# Patient Record
Sex: Male | Born: 1965 | Race: White | Hispanic: No | Marital: Single | Smoking: Former smoker
Health system: Southern US, Community
[De-identification: ages and names within clinical notes are randomized; demographics above are authoritative.]

## PROBLEM LIST (undated history)

## (undated) DIAGNOSIS — Z21 Asymptomatic human immunodeficiency virus [HIV] infection status: Secondary | ICD-10-CM

## (undated) DIAGNOSIS — B20 Human immunodeficiency virus [HIV] disease: Secondary | ICD-10-CM

---

## 2000-07-09 ENCOUNTER — Emergency Department (HOSPITAL_COMMUNITY): Admission: EM | Admit: 2000-07-09 | Discharge: 2000-07-09 | Payer: Self-pay | Admitting: Emergency Medicine

## 2002-01-25 ENCOUNTER — Ambulatory Visit (HOSPITAL_COMMUNITY): Admission: RE | Admit: 2002-01-25 | Discharge: 2002-01-25 | Payer: Self-pay | Admitting: Family Medicine

## 2002-01-25 ENCOUNTER — Encounter: Payer: Self-pay | Admitting: Family Medicine

## 2007-08-11 ENCOUNTER — Encounter: Payer: Self-pay | Admitting: Infectious Diseases

## 2007-08-18 ENCOUNTER — Ambulatory Visit: Payer: Self-pay | Admitting: Infectious Diseases

## 2007-08-18 ENCOUNTER — Telehealth: Payer: Self-pay

## 2007-08-18 ENCOUNTER — Encounter: Admission: RE | Admit: 2007-08-18 | Discharge: 2007-08-18 | Payer: Self-pay | Admitting: Infectious Diseases

## 2007-08-18 DIAGNOSIS — B20 Human immunodeficiency virus [HIV] disease: Secondary | ICD-10-CM | POA: Insufficient documentation

## 2007-08-18 LAB — CONVERTED CEMR LAB
HIV 1 RNA Quant: 210000 copies/mL — ABNORMAL HIGH (ref <50)
HIV-1 RNA Quant, Log: 5.32 — ABNORMAL HIGH (ref <1.70)

## 2007-08-22 LAB — CONVERTED CEMR LAB
ALT: 123 units/L — ABNORMAL HIGH (ref 0–53)
AST: 54 units/L — ABNORMAL HIGH (ref 0–37)
Albumin: 3.7 g/dL (ref 3.5–5.2)
Alkaline Phosphatase: 73 units/L (ref 39–117)
BUN: 10 mg/dL (ref 6–23)
Basophils Absolute: 0.1 10*3/uL (ref 0.0–0.1)
Basophils Relative: 2 % — ABNORMAL HIGH (ref 0–1)
CO2: 27 meq/L (ref 19–32)
Calcium: 8.5 mg/dL (ref 8.4–10.5)
Chlamydia, Swab/Urine, PCR: NEGATIVE
Chloride: 109 meq/L (ref 96–112)
Cholesterol: 135 mg/dL (ref 0–200)
Creatinine, Ser: 1.02 mg/dL (ref 0.40–1.50)
Eosinophils Absolute: 0 10*3/uL (ref 0.0–0.7)
Eosinophils Relative: 0 % (ref 0–5)
GC Probe Amp, Urine: NEGATIVE
Glucose, Bld: 91 mg/dL (ref 70–99)
HCT: 44.7 % (ref 39.0–52.0)
HCV Ab: NEGATIVE
HDL: 23 mg/dL — ABNORMAL LOW (ref >39)
Hemoglobin: 14.7 g/dL (ref 13.0–17.0)
Hep A Total Ab: NEGATIVE
Hep B Core Total Ab: POSITIVE — AB
Hep B S Ab: POSITIVE — AB
Hepatitis B Surface Ag: NEGATIVE
LDL Cholesterol: 88 mg/dL (ref 0–99)
Lymphocytes Relative: 58 % — ABNORMAL HIGH (ref 12–46)
Lymphs Abs: 2.9 10*3/uL (ref 0.7–4.0)
MCHC: 32.9 g/dL (ref 30.0–36.0)
MCV: 93.9 fL (ref 78.0–100.0)
Monocytes Absolute: 0.7 10*3/uL (ref 0.1–1.0)
Monocytes Relative: 14 % — ABNORMAL HIGH (ref 3–12)
Neutro Abs: 1.2 10*3/uL — ABNORMAL LOW (ref 1.7–7.7)
Neutrophils Relative %: 25 % — ABNORMAL LOW (ref 43–77)
Platelets: 96 10*3/uL — ABNORMAL LOW (ref 150–400)
Potassium: 4 meq/L (ref 3.5–5.3)
RBC: 4.76 M/uL (ref 4.22–5.81)
RDW: 12.5 % (ref 11.5–15.5)
Sodium: 145 meq/L (ref 135–145)
Total Bilirubin: 0.6 mg/dL (ref 0.3–1.2)
Total CHOL/HDL Ratio: 5.9
Total Protein: 6.5 g/dL (ref 6.0–8.3)
Triglycerides: 121 mg/dL (ref <150)
VLDL: 24 mg/dL (ref 0–40)
WBC: 4.9 10*3/uL (ref 4.0–10.5)

## 2007-09-13 ENCOUNTER — Encounter: Admission: RE | Admit: 2007-09-13 | Discharge: 2007-09-13 | Payer: Self-pay | Admitting: Infectious Diseases

## 2007-09-13 ENCOUNTER — Ambulatory Visit: Payer: Self-pay | Admitting: Infectious Diseases

## 2007-09-13 LAB — CONVERTED CEMR LAB
BUN: 16 mg/dL (ref 6–23)
Basophils Absolute: 0 10*3/uL (ref 0.0–0.1)
Basophils Relative: 1 % (ref 0–1)
CO2: 25 meq/L (ref 19–32)
Calcium: 8.9 mg/dL (ref 8.4–10.5)
Chloride: 108 meq/L (ref 96–112)
Creatinine, Ser: 0.95 mg/dL (ref 0.40–1.50)
Eosinophils Absolute: 0.1 10*3/uL (ref 0.0–0.7)
Eosinophils Relative: 2 % (ref 0–5)
Glucose, Bld: 93 mg/dL (ref 70–99)
HCT: 47.5 % (ref 39.0–52.0)
HIV 1 RNA Quant: 935 copies/mL — ABNORMAL HIGH (ref <50)
HIV-1 RNA Quant, Log: 2.97 — ABNORMAL HIGH (ref <1.70)
Hemoglobin: 15.8 g/dL (ref 13.0–17.0)
Lymphocytes Relative: 31 % (ref 12–46)
Lymphs Abs: 1.1 10*3/uL (ref 0.7–4.0)
MCHC: 33.3 g/dL (ref 30.0–36.0)
MCV: 93.9 fL (ref 78.0–100.0)
Monocytes Absolute: 0.4 10*3/uL (ref 0.1–1.0)
Monocytes Relative: 12 % (ref 3–12)
Neutro Abs: 2.1 10*3/uL (ref 1.7–7.7)
Neutrophils Relative %: 56 % (ref 43–77)
Platelets: 122 10*3/uL — ABNORMAL LOW (ref 150–400)
Potassium: 4.6 meq/L (ref 3.5–5.3)
RBC: 5.06 M/uL (ref 4.22–5.81)
RDW: 13.5 % (ref 11.5–15.5)
Sodium: 142 meq/L (ref 135–145)
WBC: 3.7 10*3/uL — ABNORMAL LOW (ref 4.0–10.5)

## 2007-12-14 ENCOUNTER — Encounter: Payer: Self-pay | Admitting: Infectious Diseases

## 2007-12-18 ENCOUNTER — Encounter: Payer: Self-pay | Admitting: Infectious Diseases

## 2007-12-26 ENCOUNTER — Encounter: Payer: Self-pay | Admitting: Infectious Diseases

## 2008-01-17 ENCOUNTER — Ambulatory Visit: Payer: Self-pay | Admitting: Infectious Diseases

## 2008-05-14 ENCOUNTER — Encounter: Payer: Self-pay | Admitting: Infectious Diseases

## 2008-06-17 ENCOUNTER — Encounter: Payer: Self-pay | Admitting: Infectious Diseases

## 2008-06-17 ENCOUNTER — Telehealth: Payer: Self-pay

## 2008-07-03 ENCOUNTER — Ambulatory Visit: Payer: Self-pay | Admitting: Infectious Diseases

## 2008-09-17 ENCOUNTER — Telehealth: Payer: Self-pay

## 2008-09-18 ENCOUNTER — Ambulatory Visit: Payer: Self-pay | Admitting: Infectious Diseases

## 2008-09-18 DIAGNOSIS — F329 Major depressive disorder, single episode, unspecified: Secondary | ICD-10-CM

## 2008-09-18 DIAGNOSIS — F32A Depression, unspecified: Secondary | ICD-10-CM | POA: Insufficient documentation

## 2008-09-20 ENCOUNTER — Telehealth (INDEPENDENT_AMBULATORY_CARE_PROVIDER_SITE_OTHER): Payer: Self-pay | Admitting: *Deleted

## 2008-10-08 ENCOUNTER — Ambulatory Visit: Payer: Self-pay | Admitting: Infectious Diseases

## 2008-10-08 DIAGNOSIS — H1045 Other chronic allergic conjunctivitis: Secondary | ICD-10-CM | POA: Insufficient documentation

## 2008-10-08 DIAGNOSIS — A539 Syphilis, unspecified: Secondary | ICD-10-CM | POA: Insufficient documentation

## 2008-10-08 DIAGNOSIS — A530 Latent syphilis, unspecified as early or late: Secondary | ICD-10-CM | POA: Insufficient documentation

## 2008-12-24 ENCOUNTER — Encounter: Payer: Self-pay | Admitting: Infectious Diseases

## 2009-01-08 ENCOUNTER — Ambulatory Visit: Payer: Self-pay | Admitting: Infectious Diseases

## 2009-06-11 ENCOUNTER — Encounter: Payer: Self-pay | Admitting: Infectious Diseases

## 2009-07-07 ENCOUNTER — Ambulatory Visit: Payer: Self-pay | Admitting: Infectious Diseases

## 2009-12-24 ENCOUNTER — Encounter: Payer: Self-pay | Admitting: Infectious Diseases

## 2010-05-15 ENCOUNTER — Encounter: Payer: Self-pay | Admitting: Infectious Diseases

## 2010-05-20 ENCOUNTER — Encounter: Payer: Self-pay | Admitting: Infectious Diseases

## 2010-05-20 ENCOUNTER — Ambulatory Visit
Admission: RE | Admit: 2010-05-20 | Discharge: 2010-05-20 | Payer: Self-pay | Source: Home / Self Care | Attending: Infectious Diseases | Admitting: Infectious Diseases

## 2010-05-20 LAB — CONVERTED CEMR LAB: HIV 1 RNA Quant: <20 copies/mL (ref <20)

## 2010-05-28 ENCOUNTER — Encounter (INDEPENDENT_AMBULATORY_CARE_PROVIDER_SITE_OTHER): Payer: Self-pay | Admitting: *Deleted

## 2010-05-28 NOTE — Assessment & Plan Note (Signed)
Summary: 62month f/u [mkj]   CC:  follow-up visit.  History of Present Illness: 45 yo M with previous dx of acute HIV and cytopenias. He was started on atripla 4-09. His CD4 has gone from CD4 346, VL 1,000; Chol 185,  RPR - (05-15-10). worried about mom who has PNA and irregular heart beat.    Preventive Screening-Counseling & Management  Alcohol-Tobacco     Alcohol drinks/day: 0     Alcohol type: quit      Smoking Status: never     Year Quit: 1990     Passive Smoke Exposure: no  Caffeine-Diet-Exercise     Caffeine use/day: coffee and tea     Does Patient Exercise: yes     Type of exercise: gym membership     Exercise (avg: min/session): >60     Times/week: 4  Hep-HIV-STD-Contraception     HIV Risk: risk noted  Safety-Violence-Falls     Seat Belt Use: yes      Sexual History:  multiple partners currently.        Drug Use:  never.        Blood Transfusions:  no.    Comments: pt. declined condoms   Updated Prior Medication List: ATRIPLA 600-200-300 MG  TABS (EFAVIRENZ-EMTRICITAB-TENOFOVIR) Take 1 tablet by mouth at bedtime MULTIVITAMINS  CAPS (MULTIPLE VITAMIN) Take 1 tablet by mouth once a day  Current Allergies (reviewed today): No known allergies  Past History:  Past medical, surgical, family and social histories (including risk factors) reviewed, and no changes noted (except as noted below).  Past Medical History: Reviewed history from 09/18/2008 and no changes required. allergic sinusitis HIV/AIDS Depression (May 2010)  Family History: Reviewed history from 09/18/2008 and no changes required. Family History Diabetes 1st degree relative Grandfathers died of MI (52's, 22's). grnadmother died of MI, 2 uncles with MI brother HIV+  Social History: Reviewed history from 08/18/2007 and no changes required. Single Never Smoked Alcohol use-yes, monthly  Drug use-no  Review of Systems       exercsing  Vital Signs:  Patient profile:   45 year old  male Height:      70 inches (177.80 cm) Weight:      196.4 pounds (89.27 kg) BMI:     28.28 Temp:     98.1 degrees F (36.72 degrees C) oral Pulse rate:   60 / minute BP sitting:   115 / 75  (left arm)  Vitals Entered By: Wendall Mola CMA Duncan Dull) (May 20, 2010 3:59 PM) CC: follow-up visit Is Patient Diabetic? No Pain Assessment Patient in pain? no      Nutritional Status BMI of 25 - 29 = overweight Nutritional Status Detail appetite "good"  Have you ever been in a relationship where you felt threatened, hurt or afraid?No   Does patient need assistance? Functional Status Self care Ambulation Normal Comments no missed doses of Atripla per pt.   Physical Exam  General:  well-developed, well-nourished, and well-hydrated.   Eyes:  pupils equal, pupils round, and pupils reactive to light.   Mouth:  pharynx pink and moist and no exudates.   Neck:  no masses.   Lungs:  normal respiratory effort and normal breath sounds.   Heart:  normal rate, regular rhythm, and no murmur.   Abdomen:  soft, non-tender, and normal bowel sounds.     Impression & Recommendations:  Problem # 1:  AIDS (ICD-042) he is doing well but has a detectable virus. WIll check a VL and  geno on him. vax uptodate. will see him back in 4-5 months, sooner if geno abn.   Orders: T-HIV Genotype ([id number]) T-HIV Viral Load [phone])  Other Orders: Est. Patient Level III (13086)         Medication Adherence: 05/20/2010   Adherence to medications reviewed with patient. Counseling to provide adequate adherence provided   Prevention For Positives: 05/20/2010   Safe sex practices discussed with patient. Condoms offered.                              Immunization History:  Influenza Immunization History:    Influenza:  historical (01/22/2010)   Appended Document: Orders Update    Clinical Lists Changes  Orders: Added new Test order of T-HIV1 Quant rflx Ultra or Genotype  ([id number]) - Signed

## 2010-05-28 NOTE — Assessment & Plan Note (Signed)
Summary: Barry Zhang   CC:  follow-up visit.  History of Present Illness: 45 yo M with previous dx of acute HIV and cytopenias. He was started on atripla 4-09. His CD4 has gone from 140 to CD4 298, VL 490, Chol 153, RPR -. (06-11-09). He is doing well- eating well, sleeping well, exercising, moving bowels well, passing urine without difficulty,   Preventive Screening-Counseling & Management  Alcohol-Tobacco     Alcohol drinks/day: 0     Alcohol type: quit      Smoking Status: never     Year Quit: 1990     Passive Smoke Exposure: no  Caffeine-Diet-Exercise     Caffeine use/day: coffee and tea     Does Patient Exercise: yes     Type of exercise: gym membership     Exercise (avg: min/session): >60     Times/week: 4  Safety-Violence-Falls     Seat Belt Use: yes   Updated Prior Medication List: TYLENOL 325 MG  TABS (ACETAMINOPHEN) as needed ATRIPLA 600-200-300 MG  TABS (EFAVIRENZ-EMTRICITAB-TENOFOVIR) Take 1 tablet by mouth at bedtime MULTIVITAMINS  CAPS (MULTIPLE VITAMIN) Take 1 tablet by mouth once a day  Current Allergies (reviewed today): No known allergies  Current Medications (verified): 1)  Tylenol 325 Mg  Tabs (Acetaminophen) .... As Needed 2)  Atripla 600-200-300 Mg  Tabs (Efavirenz-Emtricitab-Tenofovir) .... Take 1 Tablet By Mouth At Bedtime 3)  Multivitamins  Caps (Multiple Vitamin) .... Take 1 Tablet By Mouth Once A Day  Allergies (verified): No Known Drug Allergies   Vital Signs:  Patient profile:   45 year old male Height:      70 inches (177.80 cm) Weight:      197.8 pounds (89.91 kg) BMI:     28.48 Temp:     97.2 degrees F (36.22 degrees C) oral Pulse rate:   76 / minute BP sitting:   109 / 76  (right arm)  Vitals Entered By: Baxter Hire) (July 07, 2009 9:00 AM) CC: follow-up visit Pain Assessment Patient in pain? no      Nutritional Status BMI of 25 - 29 = overweight Nutritional Status Detail appetite is good per patient  Have you ever  been in a relationship where you felt threatened, hurt or afraid?No   Does patient need assistance? Functional Status Self care Ambulation Normal        Medication Adherence: 07/07/2009   Adherence to medications reviewed with patient. Counseling to provide adequate adherence provided   Prevention For Positives: 07/07/2009   Safe sex practices discussed with patient. Condoms offered.                             Physical Exam  General:  well-developed, well-nourished, and well-hydrated.   Eyes:  pupils equal, pupils round, and pupils reactive to light.   Mouth:  pharynx pink and moist and no exudates.   Neck:  no masses.   Lungs:  normal respiratory effort and normal breath sounds.   Heart:  normal rate, regular rhythm, and no murmur.   Abdomen:  soft, non-tender, and normal bowel sounds.     Impression & Recommendations:  Problem # 1:  AIDS (ICD-042) doing well. he will return to clinic 1 year with labs done in 6 months at his work and faxed here. he is offered condoms.   Problem # 2:  DEPRESSION (ICD-311) spoke with him at length. he is not depressed. has not sought out support  groups, just simply feels like he is "living with it" and accepted dx.   Other Orders: Est. Patient Level IV (54098)

## 2010-05-29 ENCOUNTER — Telehealth (INDEPENDENT_AMBULATORY_CARE_PROVIDER_SITE_OTHER): Payer: Self-pay | Admitting: *Deleted

## 2010-06-03 ENCOUNTER — Encounter (INDEPENDENT_AMBULATORY_CARE_PROVIDER_SITE_OTHER): Payer: Self-pay | Admitting: *Deleted

## 2010-06-03 NOTE — Progress Notes (Signed)
Summary: lab results  Phone Note Call from Patient   Caller: Patient Reason for Call: Lab or Test Results Summary of Call: pt. called for lab results, results given Initial call taken by: Wendall Mola CMA Duncan Dull),  May 29, 2010 11:07 AM

## 2010-06-03 NOTE — Miscellaneous (Signed)
  Clinical Lists Changes  Observations: Added new observation of INCOMESOURCE: UNKNOWN (05/28/2010 14:05) Added new observation of HOUSEINCOME: 0  (05/28/2010 14:05) Added new observation of #CHILD<18 IN: Unknown  (05/28/2010 14:05) Added new observation of FAMILYSIZE: 1  (05/28/2010 14:05) Added new observation of HOUSING: Unknown  (05/28/2010 14:05) Added new observation of YEARLYEXPEN: 0  (05/28/2010 14:05)

## 2010-06-11 NOTE — Miscellaneous (Signed)
  Clinical Lists Changes 

## 2011-01-19 LAB — T-HELPER CELL (CD4) - (RCID CLINIC ONLY)
CD4 % Helper T Cell: 5 — ABNORMAL LOW
CD4 T Cell Abs: 140 — ABNORMAL LOW

## 2011-01-20 LAB — T-HELPER CELL (CD4) - (RCID CLINIC ONLY): CD4 T Cell Abs: 250 — ABNORMAL LOW

## 2011-06-23 ENCOUNTER — Other Ambulatory Visit: Payer: Self-pay | Admitting: *Deleted

## 2011-06-23 DIAGNOSIS — B20 Human immunodeficiency virus [HIV] disease: Secondary | ICD-10-CM

## 2011-06-23 MED ORDER — EFAVIRENZ-EMTRICITAB-TENOFOVIR 600-200-300 MG PO TABS: 1.0000 | ORAL_TABLET | Freq: Every day | ORAL | Status: DC

## 2011-07-13 ENCOUNTER — Ambulatory Visit: Payer: Self-pay | Admitting: Infectious Diseases

## 2011-07-14 ENCOUNTER — Ambulatory Visit (INDEPENDENT_AMBULATORY_CARE_PROVIDER_SITE_OTHER): Payer: BC Managed Care – PPO | Admitting: Infectious Disease

## 2011-07-14 ENCOUNTER — Telehealth: Payer: Self-pay | Admitting: Licensed Clinical Social Worker

## 2011-07-14 ENCOUNTER — Other Ambulatory Visit: Payer: Self-pay | Admitting: Infectious Disease

## 2011-07-14 ENCOUNTER — Encounter: Payer: Self-pay | Admitting: Infectious Disease

## 2011-07-14 VITALS — BP 143/95 | HR 78 | Temp 97.4°F | Ht 70.0 in | Wt 187.3 lb

## 2011-07-14 DIAGNOSIS — J029 Acute pharyngitis, unspecified: Secondary | ICD-10-CM

## 2011-07-14 DIAGNOSIS — B20 Human immunodeficiency virus [HIV] disease: Secondary | ICD-10-CM

## 2011-07-14 DIAGNOSIS — R300 Dysuria: Secondary | ICD-10-CM

## 2011-07-14 DIAGNOSIS — Z113 Encounter for screening for infections with a predominantly sexual mode of transmission: Secondary | ICD-10-CM

## 2011-07-14 LAB — COMPLETE METABOLIC PANEL WITH GFR
ALT: 19 U/L (ref 0–53)
Albumin: 4.1 g/dL (ref 3.5–5.2)
Alkaline Phosphatase: 79 U/L (ref 39–117)
GFR, Est Non African American: >89 mL/min
Glucose, Bld: 115 mg/dL — ABNORMAL HIGH (ref 70–99)
Potassium: 4.3 mEq/L (ref 3.5–5.3)
Sodium: 144 mEq/L (ref 135–145)
Total Bilirubin: 0.4 mg/dL (ref 0.3–1.2)
Total Protein: 6.5 g/dL (ref 6.0–8.3)

## 2011-07-14 LAB — CBC WITH DIFFERENTIAL/PLATELET
Eosinophils Relative: 2 % (ref 0–5)
HCT: 44.2 % (ref 39.0–52.0)
Lymphocytes Relative: 15 % (ref 12–46)
Lymphs Abs: 0.7 10*3/uL (ref 0.7–4.0)
MCV: 95.7 fL (ref 78.0–100.0)
Monocytes Absolute: 0.5 10*3/uL (ref 0.1–1.0)
Monocytes Relative: 10 % (ref 3–12)
RBC: 4.62 MIL/uL (ref 4.22–5.81)
WBC: 4.8 10*3/uL (ref 4.0–10.5)

## 2011-07-14 LAB — URINALYSIS, ROUTINE W REFLEX MICROSCOPIC
Bilirubin Urine: NEGATIVE
Glucose, UA: NEGATIVE mg/dL
Protein, ur: NEGATIVE mg/dL
Urobilinogen, UA: 0.2 mg/dL (ref 0.0–1.0)

## 2011-07-14 LAB — URINALYSIS, MICROSCOPIC ONLY
Bacteria, UA: NONE SEEN
Casts: NONE SEEN
WBC, UA: >50 WBC/hpf — AB (ref <3)

## 2011-07-14 MED ORDER — LEVOFLOXACIN 500 MG PO TABS: 500.0000 mg | ORAL_TABLET | Freq: Every day | ORAL | Status: AC

## 2011-07-14 NOTE — Assessment & Plan Note (Signed)
Could have UTI. Check urine culture post prostate exam. Will give levaquin for 10 days empirically pending cultures

## 2011-07-14 NOTE — Telephone Encounter (Signed)
Patient called c/o painful urination, frequency, sore throat, past fevers and numbness in his groin area. He spoke with Dr. Luciana Axe this morning and was advised to make an appointment per the patient. I scheduled him to see Dr. Daiva Eves at 11:00am today.

## 2011-07-14 NOTE — Assessment & Plan Note (Signed)
No exudate. Not clear what link is if any to urinary and GU symptoms here

## 2011-07-14 NOTE — Assessment & Plan Note (Signed)
Emphasized need for more frequent visits given his viremia a year ago and his imperfect virological suppression. VL should be <20 consistently

## 2011-07-14 NOTE — Progress Notes (Signed)
Subjective:    Patient ID: Barry Zhang, male    DOB: [birth year], 46 y.o.   MRN: [id number]  HPI  46  Year old man with HIV previously well controlled on atripla but with recent viremia to 1k range last January 2012, most recent VL is 200 in February. His CD4 is 269 (all labs done at [facility]) who presents to clinic after onset of sorethroat and fevers on Sunday followed by numbness in his glans penis and dysuria and strong smelling urine. He has some suprapubic pain as well. He does not have nocturia. He denies being sexually active past several months. I spent greater than 45 minutes with the patient including greater than 50% of time in face to face counsel of the patient and in coordination of their care.   Review of Systems  Constitutional: Positive for activity change. Negative for fever, chills, diaphoresis, appetite change, fatigue and unexpected weight change.  HENT: Positive for sore throat. Negative for congestion, rhinorrhea, sneezing, trouble swallowing and sinus pressure.   Eyes: Negative for photophobia and visual disturbance.  Respiratory: Negative for cough, chest tightness, shortness of breath, wheezing and stridor.   Cardiovascular: Negative for chest pain, palpitations and leg swelling.  Gastrointestinal: Negative for nausea, vomiting, abdominal pain, diarrhea, constipation, blood in stool, abdominal distention and anal bleeding.  Genitourinary: Positive for dysuria. Negative for hematuria, flank pain and difficulty urinating.  Musculoskeletal: Negative for myalgias, back pain, joint swelling, arthralgias and gait problem.  Skin: Negative for color change, pallor, rash and wound.  Neurological: Negative for dizziness, tremors, weakness and light-headedness.  Hematological: Negative for adenopathy. Does not bruise/bleed easily.  Psychiatric/Behavioral: Negative for behavioral problems, confusion, sleep disturbance, dysphoric mood, decreased concentration and agitation.        Objective:   Physical Exam  Constitutional: He is oriented to person, place, and time. He appears well-developed and well-nourished. No distress.  HENT:  Head: Normocephalic and atraumatic.  Mouth/Throat: Oropharynx is clear and moist. No oropharyngeal exudate, posterior oropharyngeal edema, posterior oropharyngeal erythema or tonsillar abscesses.  Eyes: Conjunctivae and EOM are normal. Pupils are equal, round, and reactive to light. No scleral icterus.  Neck: Normal range of motion. Neck supple. No JVD present.  Cardiovascular: Normal rate, regular rhythm and normal heart sounds.  Exam reveals no gallop and no friction rub.   No murmur heard. Pulmonary/Chest: Effort normal and breath sounds normal. No respiratory distress. He has no wheezes. He has no rales. He exhibits no tenderness.  Abdominal: He exhibits no distension and no mass. There is no tenderness. There is no rebound and no guarding. Hernia confirmed negative in the right inguinal area and confirmed negative in the left inguinal area.  Genitourinary: Rectum normal and testes normal. Prostate is not enlarged and not tender. Right testis shows no mass. Left testis shows no mass. Circumcised. No penile erythema or penile tenderness. No discharge found.  Musculoskeletal: He exhibits no edema and no tenderness.  Lymphadenopathy:    He has no cervical adenopathy.       Right: No inguinal adenopathy present.       Left: No inguinal adenopathy present.  Neurological: He is alert and oriented to person, place, and time. He has normal reflexes. He exhibits normal muscle tone. Coordination normal.  Skin: Skin is warm and dry. He is not diaphoretic. No erythema. No pallor.  Psychiatric: He has a normal mood and affect. His behavior is normal. Judgment and thought content normal.  Assessment & Plan:  Dysuria Could have UTI. Check urine culture post prostate exam. Will give levaquin for 10 days empirically pending  cultures  Sore throat No exudate. Not clear what link is if any to urinary and GU symptoms here  AIDS Emphasized need for more frequent visits given his viremia a year ago and his imperfect virological suppression. VL should be <20 consistently

## 2011-07-16 ENCOUNTER — Telehealth: Payer: Self-pay | Admitting: *Deleted

## 2011-07-16 NOTE — Telephone Encounter (Signed)
He was here 2 days ago & wanted to know the lab results. I gave him what was in so far. He is going to call back on Monday to see if the rest are in

## 2011-08-04 ENCOUNTER — Telehealth: Payer: Self-pay | Admitting: *Deleted

## 2011-08-04 ENCOUNTER — Ambulatory Visit: Payer: Self-pay | Admitting: Infectious Diseases

## 2011-08-04 NOTE — Telephone Encounter (Signed)
I left a message asking him to call & reschedule the appt

## 2011-10-18 ENCOUNTER — Other Ambulatory Visit: Payer: Self-pay | Admitting: *Deleted

## 2011-10-18 DIAGNOSIS — B20 Human immunodeficiency virus [HIV] disease: Secondary | ICD-10-CM

## 2011-10-18 MED ORDER — EFAVIRENZ-EMTRICITAB-TENOFOVIR 600-200-300 MG PO TABS: 1.0000 | ORAL_TABLET | Freq: Every day | ORAL | Status: DC

## 2012-01-11 ENCOUNTER — Other Ambulatory Visit: Payer: Self-pay | Admitting: Infectious Diseases

## 2012-01-31 ENCOUNTER — Ambulatory Visit (INDEPENDENT_AMBULATORY_CARE_PROVIDER_SITE_OTHER): Payer: BC Managed Care – PPO | Admitting: Infectious Diseases

## 2012-01-31 ENCOUNTER — Encounter: Payer: Self-pay | Admitting: Infectious Diseases

## 2012-01-31 VITALS — BP 137/83 | HR 64 | Temp 97.6°F | Ht 70.0 in | Wt 187.0 lb

## 2012-01-31 DIAGNOSIS — B20 Human immunodeficiency virus [HIV] disease: Secondary | ICD-10-CM

## 2012-01-31 DIAGNOSIS — F329 Major depressive disorder, single episode, unspecified: Secondary | ICD-10-CM

## 2012-01-31 DIAGNOSIS — F3289 Other specified depressive episodes: Secondary | ICD-10-CM

## 2012-01-31 DIAGNOSIS — R21 Rash and other nonspecific skin eruption: Secondary | ICD-10-CM

## 2012-01-31 NOTE — Assessment & Plan Note (Signed)
Still has occas hives. Seems to be stress related.

## 2012-01-31 NOTE — Progress Notes (Signed)
  Subjective:    Patient ID: Barry Zhang, male    DOB: 18-Aug-1965, 46 y.o.   MRN: [id number]  HPI 46 yo M with previous dx of acute HIV and cytopenias. He was started on atripla 4-09. AT previous f/u had detectable VL. Missed last f/u appt (seen March for acute visit/UTI). Has labs done at work- last (01-10-12) VL 280 and CD4 328.     Review of Systems  Constitutional: Negative for fever, chills, appetite change and unexpected weight change.  Gastrointestinal: Negative for diarrhea and constipation.  Genitourinary: Negative for dysuria.       Objective:   Physical Exam  Constitutional: He appears well-developed and well-nourished.  HENT:  Mouth/Throat: No oropharyngeal exudate.  Eyes: EOM are normal. Pupils are equal, round, and reactive to light.  Neck: Neck supple.  Cardiovascular: Normal rate, regular rhythm and normal heart sounds.   Pulmonary/Chest: Effort normal.  Abdominal: Soft. Bowel sounds are normal. There is no tenderness.  Lymphadenopathy:    He has no cervical adenopathy.          Assessment & Plan:

## 2012-01-31 NOTE — Assessment & Plan Note (Signed)
Spoke with him at length about his detectable VL. He swears adherence. He is not sexually active. He gets his labs done at work. Got flu shot at work as well. Will see hm back in 6 months.

## 2012-01-31 NOTE — Assessment & Plan Note (Signed)
He becomes tearful when talking about his mother's recent cancer illness. She will have her clearance visit this month.Marland KitchenMarland Kitchen

## 2012-08-11 ENCOUNTER — Other Ambulatory Visit: Payer: Self-pay | Admitting: Infectious Diseases

## 2012-08-14 ENCOUNTER — Ambulatory Visit: Payer: BC Managed Care – PPO | Admitting: Infectious Diseases

## 2012-08-22 ENCOUNTER — Ambulatory Visit (INDEPENDENT_AMBULATORY_CARE_PROVIDER_SITE_OTHER): Payer: PRIVATE HEALTH INSURANCE | Admitting: Infectious Diseases

## 2012-08-22 ENCOUNTER — Encounter: Payer: Self-pay | Admitting: Infectious Diseases

## 2012-08-22 VITALS — BP 135/83 | HR 55 | Temp 97.7°F | Ht 71.0 in | Wt 189.5 lb

## 2012-08-22 DIAGNOSIS — Z23 Encounter for immunization: Secondary | ICD-10-CM

## 2012-08-22 DIAGNOSIS — B20 Human immunodeficiency virus [HIV] disease: Secondary | ICD-10-CM

## 2012-08-22 NOTE — Assessment & Plan Note (Signed)
Mr Ripple is doing very well. He denies missed ART. He is offered/refuses condoms. He is encouraged to watch his diet, exercise, wear his seat belt. He has a slightly elevated cardiac risk (HIV+) but no relatives with early cardiac death (9s and 18s). He is due for colonoscopy at 50.  Will see him back in 1 year, he will repeat his labs in 6 months and send them to [place].

## 2012-08-22 NOTE — Progress Notes (Signed)
  Subjective:    Patient ID: VALERIANO BAIN, male    DOB: 1966/02/26, 47 y.o.   MRN: [id number]  HPI 47 yo M with previous dx of acute HIV and cytopenias. He was started on atripla 4-09. He is without complaint (aside from waiting in room...) His labs are done at his work- CD4 265, VL 40, Chol 148, Trig 73.   HIV 1 RNA Quant (copies/mL)  Date Value  05/20/2010 <20 copies/mL   09/13/2007 935*  08/18/2007 210000*     CD4 T Cell Abs (no units)  Date Value  09/13/2007 250*  08/18/2007 140*     Review of Systems     Objective:   Physical Exam  Constitutional: He appears well-developed and well-nourished.  HENT:  Mouth/Throat: No oropharyngeal exudate.  Eyes: EOM are normal. Pupils are equal, round, and reactive to light.  Neck: Neck supple.  Cardiovascular: Normal rate, regular rhythm and normal heart sounds.   Pulmonary/Chest: Effort normal and breath sounds normal.  Abdominal: Soft. Bowel sounds are normal. There is no tenderness.  Lymphadenopathy:    He has no cervical adenopathy.          Assessment & Plan:

## 2013-02-28 ENCOUNTER — Telehealth: Payer: Self-pay | Admitting: *Deleted

## 2013-02-28 NOTE — Telephone Encounter (Signed)
REQUESTED PT CALL [facility].  NEEDING 42-MONTH F/U APPTS FOR LABWORK AND MD

## 2013-03-12 ENCOUNTER — Other Ambulatory Visit: Payer: Self-pay | Admitting: Infectious Diseases

## 2013-07-24 ENCOUNTER — Telehealth: Payer: Self-pay | Admitting: *Deleted

## 2013-07-24 NOTE — Telephone Encounter (Signed)
Patient called to advised that he has a rash on his trunk and that his viral load is up. 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 Advised he needs to see the doctor asap. Advised him we can see him 07/25/13 at 315 pm and to bring a copy of his most recent labs as I do not see them in the chart. He advised he will be here with the labs.

## 2013-07-25 ENCOUNTER — Ambulatory Visit (INDEPENDENT_AMBULATORY_CARE_PROVIDER_SITE_OTHER): Payer: PRIVATE HEALTH INSURANCE | Admitting: Infectious Diseases

## 2013-07-25 ENCOUNTER — Encounter: Payer: Self-pay | Admitting: Infectious Diseases

## 2013-07-25 VITALS — BP 138/89 | HR 103 | Temp 98.1°F | Ht 71.0 in | Wt 197.0 lb

## 2013-07-25 DIAGNOSIS — I10 Essential (primary) hypertension: Secondary | ICD-10-CM

## 2013-07-25 DIAGNOSIS — R21 Rash and other nonspecific skin eruption: Secondary | ICD-10-CM

## 2013-07-25 DIAGNOSIS — B20 Human immunodeficiency virus [HIV] disease: Secondary | ICD-10-CM

## 2013-07-25 DIAGNOSIS — L270 Generalized skin eruption due to drugs and medicaments taken internally: Secondary | ICD-10-CM | POA: Insufficient documentation

## 2013-07-25 MED ORDER — PREDNISONE (PAK) 10 MG PO TABS: ORAL_TABLET | Freq: Every day | ORAL | Status: DC

## 2013-07-25 MED ORDER — CIMETIDINE 200 MG PO TABS: 200.0000 mg | ORAL_TABLET | Freq: Two times a day (BID) | ORAL | Status: DC

## 2013-07-25 NOTE — Assessment & Plan Note (Signed)
Will give him prolonged steroid dose pack . Will restart H2 blocker. If not improved will have him see allergy.
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
## 2013-07-25 NOTE — Assessment & Plan Note (Signed)
He appears to be doing well. i suspect his increase his WBC is due to recent steroid use. Await results of his labs from his work. hsi vax ar up to date. Will see him back in 6 months.

## 2013-08-13 ENCOUNTER — Telehealth: Payer: Self-pay | Admitting: *Deleted

## 2013-08-13 NOTE — Telephone Encounter (Signed)
Called patient and left a voice mail for him to have his labs done; a CD4 and viral load. He can either have them done at his employment or come here. Wendall MolaJacqueline Cockerham

## 2013-10-13 ENCOUNTER — Other Ambulatory Visit: Payer: Self-pay | Admitting: Infectious Diseases

## 2013-10-17 ENCOUNTER — Telehealth: Payer: Self-pay | Admitting: *Deleted

## 2013-10-17 NOTE — Telephone Encounter (Signed)
Called patient and left voice mail for him to call the office to schedule a lab appt, CD4 and viral load. Or he can have the labs done at his employment and faxed here. Wendall MolaJacqueline Cockerham

## 2013-11-08 ENCOUNTER — Ambulatory Visit: Payer: PRIVATE HEALTH INSURANCE | Admitting: Internal Medicine

## 2013-11-12 ENCOUNTER — Emergency Department (HOSPITAL_COMMUNITY)
Admission: EM | Admit: 2013-11-12 | Discharge: 2013-11-12 | Disposition: A | Discharge status: Home / Self Care | Payer: PRIVATE HEALTH INSURANCE | Attending: Emergency Medicine | Admitting: Emergency Medicine

## 2013-11-12 ENCOUNTER — Encounter (HOSPITAL_COMMUNITY): Payer: Self-pay | Admitting: Emergency Medicine

## 2013-11-12 DIAGNOSIS — Z87891 Personal history of nicotine dependence: Secondary | ICD-10-CM | POA: Insufficient documentation

## 2013-11-12 DIAGNOSIS — L03119 Cellulitis of unspecified part of limb: Secondary | ICD-10-CM | POA: Insufficient documentation

## 2013-11-12 DIAGNOSIS — Z21 Asymptomatic human immunodeficiency virus [HIV] infection status: Secondary | ICD-10-CM | POA: Insufficient documentation

## 2013-11-12 DIAGNOSIS — T6391XA Toxic effect of contact with unspecified venomous animal, accidental (unintentional), initial encounter: Secondary | ICD-10-CM | POA: Insufficient documentation

## 2013-11-12 DIAGNOSIS — Z79899 Other long term (current) drug therapy: Secondary | ICD-10-CM | POA: Insufficient documentation

## 2013-11-12 DIAGNOSIS — T63461A Toxic effect of venom of wasps, accidental (unintentional), initial encounter: Secondary | ICD-10-CM | POA: Insufficient documentation

## 2013-11-12 DIAGNOSIS — L03114 Cellulitis of left upper limb: Secondary | ICD-10-CM

## 2013-11-12 DIAGNOSIS — IMO0002 Reserved for concepts with insufficient information to code with codable children: Secondary | ICD-10-CM | POA: Insufficient documentation

## 2013-11-12 DIAGNOSIS — Y929 Unspecified place or not applicable: Secondary | ICD-10-CM | POA: Insufficient documentation

## 2013-11-12 DIAGNOSIS — Y939 Activity, unspecified: Secondary | ICD-10-CM | POA: Insufficient documentation

## 2013-11-12 DIAGNOSIS — T63441A Toxic effect of venom of bees, accidental (unintentional), initial encounter: Secondary | ICD-10-CM

## 2013-11-12 DIAGNOSIS — L02519 Cutaneous abscess of unspecified hand: Secondary | ICD-10-CM | POA: Insufficient documentation

## 2013-11-12 HISTORY — DX: Human immunodeficiency virus (HIV) disease: B20

## 2013-11-12 HISTORY — DX: Asymptomatic human immunodeficiency virus (hiv) infection status: Z21

## 2013-11-12 MED ORDER — EPINEPHRINE 0.3 MG/0.3ML IJ SOAJ: 0.3000 mg | INTRAMUSCULAR | Status: DC | PRN

## 2013-11-12 MED ORDER — DEXAMETHASONE SODIUM PHOSPHATE 10 MG/ML IJ SOLN
10.0000 mg | Freq: Once | INTRAMUSCULAR | Status: AC
  Administered 2013-11-12: 10 mg via INTRAMUSCULAR
  Filled 2013-11-12: qty 1

## 2013-11-12 MED ORDER — CEPHALEXIN 500 MG PO CAPS: 500.0000 mg | ORAL_CAPSULE | Freq: Four times a day (QID) | ORAL | Status: DC

## 2013-11-12 MED ORDER — CEPHALEXIN 500 MG PO CAPS
500.0000 mg | ORAL_CAPSULE | Freq: Once | ORAL | Status: AC
  Administered 2013-11-12: 500 mg via ORAL
  Filled 2013-11-12: qty 1

## 2013-11-12 NOTE — ED Notes (Signed)
Pt states that he was stung by a bee on his L hand approx. 13 hours ago and has taken oral benadryl w/o relief from the swelling. Also states that he feels like he's having throat swelling. Speaking in complete sentences.

## 2013-11-12 NOTE — Discharge Instructions (Signed)
Continue to monitor your bee sting reaction and swelling. Continue to take Benadryl for itching. Use elevation to reduce swelling. Have a recheck of your symptoms in the next 24 hours. If you have any worsening symptoms, increasing swelling, pain, or fever return to the emergency room or followup with your doctor.     Bee, Wasp, or Hornet Sting Your caregiver has diagnosed you as having an insect sting. An insect sting appears as a red lump in the skin that sometimes has a tiny hole in the center, or it may have a stinger in the center of the wound. The most common stings are from wasps, hornets and bees. Individuals have different reactions to insect stings.  A normal reaction may cause pain, swelling, and redness around the sting site.  A localized allergic reaction may cause swelling and redness that extends beyond the sting site.  A large local reaction may continue to develop over the next 12 to 36 hours.  On occasion, the reactions can be severe (anaphylactic reaction). An anaphylactic reaction may cause wheezing; difficulty breathing; chest pain; fainting; raised, itchy, red patches on the skin; a sick feeling to your stomach (nausea); vomiting; cramping; or diarrhea. If you have had an anaphylactic reaction to an insect sting in the past, you are more likely to have one again. HOME CARE INSTRUCTIONS   With bee stings, a small sac of poison is left in the wound. Brushing across this with something such as a credit card, or anything similar, will help remove this and decrease the amount of the reaction. This same procedure will not help a wasp sting as they do not leave behind a stinger and poison sac.  Apply a cold compress for 10 to 20 minutes every hour for 1 to 2 days, depending on severity, to reduce swelling and itching.  To lessen pain, a paste made of water and baking soda may be rubbed on the bite or sting and left on for 5 minutes.  To relieve itching and swelling, you may use  take medication or apply medicated creams or lotions as directed.  Only take over-the-counter or prescription medicines for pain, discomfort, or fever as directed by your caregiver.  Wash the sting site daily with soap and water. Apply antibiotic ointment on the sting site as directed.  If you suffered a severe reaction:  If you did not require hospitalization, an adult will need to stay with you for 24 hours in case the symptoms return.  You may need to wear a medical bracelet or necklace stating the allergy.  You and your family need to learn when and how to use an anaphylaxis kit or epinephrine injection.  If you have had a severe reaction before, always carry your anaphylaxis kit with you. SEEK MEDICAL CARE IF:   None of the above helps within 2 to 3 days.  The area becomes red, warm, tender, and swollen beyond the area of the bite or sting.  You have an oral temperature above 102 F (38.9 C). SEEK IMMEDIATE MEDICAL CARE IF:  You have symptoms of an allergic reaction which are:  Wheezing.  Difficulty breathing.  Chest pain.  Lightheadedness or fainting.  Itchy, raised, red patches on the skin.  Nausea, vomiting, cramping or diarrhea. ANY OF THESE SYMPTOMS MAY REPRESENT A SERIOUS PROBLEM THAT IS AN EMERGENCY. Do not wait to see if the symptoms will go away. Get medical help right away. Call your local emergency services (911 in [place] DO NOT drive  yourself to the hospital. MAKE SURE YOU:   Understand these instructions.  Will watch your condition.  Will get help right away if you are not doing well or get worse. Document Released: 04/12/2005 Document Revised: 07/05/2011 Document Reviewed: 09/27/2009 [facility] Patient Information 2015 [place], [place]. This information is not intended to replace advice given to you by your health care provider. Make sure you discuss any questions you have with your health care provider.   Cellulitis Cellulitis is an infection of the  skin and the tissue beneath it. The infected area is usually red and tender. Cellulitis occurs most often in the arms and lower legs.  CAUSES  Cellulitis is caused by bacteria that enter the skin through cracks or cuts in the skin. The most common types of bacteria that cause cellulitis are Staphylococcus and Streptococcus. SYMPTOMS   Redness and warmth.  Swelling.  Tenderness or pain.  Fever. DIAGNOSIS  Your caregiver can usually determine what is wrong based on a physical exam. Blood tests may also be done. TREATMENT  Treatment usually involves taking an antibiotic medicine. HOME CARE INSTRUCTIONS   Take your antibiotics as directed. Finish them even if you start to feel better.  Keep the infected arm or leg elevated to reduce swelling.  Apply a warm cloth to the affected area up to 4 times per day to relieve pain.  Only take over-the-counter or prescription medicines for pain, discomfort, or fever as directed by your caregiver.  Keep all follow-up appointments as directed by your caregiver. SEEK MEDICAL CARE IF:   You notice red streaks coming from the infected area.  Your red area gets larger or turns dark in color.  Your bone or joint underneath the infected area becomes painful after the skin has healed.  Your infection returns in the same area or another area.  You notice a swollen bump in the infected area.  You develop new symptoms. SEEK IMMEDIATE MEDICAL CARE IF:   You have a fever.  You feel very sleepy.  You develop vomiting or diarrhea.  You have a general ill feeling (malaise) with muscle aches and pains. MAKE SURE YOU:   Understand these instructions.  Will watch your condition.  Will get help right away if you are not doing well or get worse. Document Released: 01/20/2005 Document Revised: 10/12/2011 Document Reviewed: 06/28/2011 [facility] Patient Information 2015 [place], [place]. This information is not intended to replace advice given to you  by your health care provider. Make sure you discuss any questions you have with your health care provider.

## 2013-11-12 NOTE — ED Provider Notes (Signed)
CSN: [id number]     Arrival date & time 11/12/13  0240 History   First MD Initiated Contact with Patient 11/12/13 [phone]     Chief Complaint  Patient presents with  . Insect Bite   HPI  History provided by the patient. Patient is a 48 year old male with history of HIV and previous allergic reaction to bee sting who presents with itching and swelling of the left hand after bee sting earlier yesterday. Patient reports having a bee sting in the afternoon yesterday his left knee is fifth MCP joint. He initially had a small area of redness and swelling with slight itching. He did take some Benadryl through out the day and ranitidine. Later in the night and early this morning he began having worsening itching to the hand as well as swelling of the hand. He does report some mild tenderness only when pressing on the area. Denies any other aggravating or alleviating factors. Denies any other symptoms to me. Denies any difficulty breathing any tightness in the throat or mouth. Patient reports that he had a good CD4 count recently.    Past Medical History  Diagnosis Date  . HIV (human immunodeficiency virus infection)    History reviewed. No pertinent past surgical history. No family history on file. History  Substance Use Topics  . Smoking status: Former Smoker    Quit date: 07/14/1991  . Smokeless tobacco: Never Used  . Alcohol Use: No     Comment: rare wine    Review of Systems  All other systems reviewed and are negative.     Allergies  Bee venom  Home Medications   Prior to Admission medications   Medication Sig Start Date End Date Taking? Authorizing Provider  ATRIPLA 600-200-300 MG per tablet take 1 tablet by mouth at bedtime    Ginnie SmartJeffrey C Hatcher, MD  cimetidine (TAGAMET HB) 200 MG tablet Take 1 tablet (200 mg total) by mouth 2 (two) times daily. 07/25/13   Ginnie SmartJeffrey C Hatcher, MD  predniSONE (STERAPRED UNI-PAK) 10 MG tablet Take by mouth daily. 6 tabs po qday for 3 days, 5 tab po qday  for 3 days, 4 tab po qday for 3 days, 3 tab po qday for 3 days, 2 tabs po qday for 3 days, 1 tab po qday for 3 days, 1/2 tab po qday for 3 days. 07/25/13   Ginnie SmartJeffrey C Hatcher, MD   BP 142/73  Pulse 61  Resp 18  SpO2 100% Physical Exam  Nursing note and vitals reviewed. Constitutional: He appears well-developed and well-nourished.  HENT:  Head: Normocephalic.  Cardiovascular: Normal rate and regular rhythm.   Pulmonary/Chest: Effort normal and breath sounds normal. No respiratory distress.  Musculoskeletal:  There is diffuse swelling of the left hand. Mild erythema with increased warmth. No erythematous streaks. Mild pain to palpation. Small site of possible bee sting to 5th mcp area. There is no stinger remaining.  Neurological: He is alert.  Skin: Skin is warm.  Psychiatric: He has a normal mood and affect. His behavior is normal.    ED Course  Procedures   COORDINATION OF CARE:  Nursing notes reviewed. Vital signs reviewed. Initial pt interview and examination performed.   Filed Vitals:   11/12/13 0252  BP: 142/73  Pulse: 61  Resp: 18  SpO2: 100%    3:28 AM-patient seen and evaluated. Patient with history of bee sting to left hand. Does report a previous history of allergic reactions to bee stings. Only had a small amount  of swelling initially but through the night has developed more swelling of the hand with itching. There is only mild tenderness when palpated otherwise denies pain.  Patient observed. Decadron given IM. I do have some concerns for possible secondary cellulitis and  keflex given. No significant changes seen in swelling. Has not been any worsening. At this time patient felt stable to return home. Will give prescription for epinephrine pens as well as keflex.   Treatment plan initiated: Medications  dexamethasone (DECADRON) injection 10 mg (not administered)       MDM   Final diagnoses:  Bee sting reaction, accidental or unintentional, initial  encounter          Angus Seller, PA-C 11/12/13 [phone]

## 2013-11-15 NOTE — ED Provider Notes (Signed)
Medical screening examination/treatment/procedure(s) were performed by non-physician practitioner and as supervising physician I was immediately available for consultation/collaboration.   Candyce ChurnJohn David Linsie Lupo III, MD 11/15/13 [phone]

## 2014-03-07 ENCOUNTER — Other Ambulatory Visit: Payer: Self-pay | Admitting: Infectious Diseases

## 2014-03-07 DIAGNOSIS — B20 Human immunodeficiency virus [HIV] disease: Secondary | ICD-10-CM

## 2014-04-01 ENCOUNTER — Telehealth: Payer: Self-pay | Admitting: *Deleted

## 2014-04-01 NOTE — Telephone Encounter (Signed)
Left patient a voice mail to schedule appointment. Per Dr. Ninetta LightsHatcher he needs follow up and lab work. Last visit 07/25/13. Barry MolaJacqueline Lennyx Verdell

## 2014-04-29 ENCOUNTER — Encounter: Payer: Self-pay | Admitting: Infectious Diseases

## 2014-04-29 ENCOUNTER — Ambulatory Visit (INDEPENDENT_AMBULATORY_CARE_PROVIDER_SITE_OTHER): Payer: PRIVATE HEALTH INSURANCE | Admitting: Infectious Diseases

## 2014-04-29 VITALS — BP 137/99 | HR 58 | Temp 97.3°F | Wt 205.0 lb

## 2014-04-29 DIAGNOSIS — B20 Human immunodeficiency virus [HIV] disease: Secondary | ICD-10-CM

## 2014-04-29 DIAGNOSIS — A539 Syphilis, unspecified: Secondary | ICD-10-CM

## 2014-04-29 DIAGNOSIS — Z113 Encounter for screening for infections with a predominantly sexual mode of transmission: Secondary | ICD-10-CM

## 2014-04-29 DIAGNOSIS — Z79899 Other long term (current) drug therapy: Secondary | ICD-10-CM

## 2014-04-29 LAB — LIPID PANEL
CHOL/HDL RATIO: 3.8 ratio
CHOLESTEROL: 168 mg/dL (ref 0–200)
HDL: 44 mg/dL (ref >39)
LDL Cholesterol: 101 mg/dL — ABNORMAL HIGH (ref 0–99)
TRIGLYCERIDES: 115 mg/dL (ref <150)
VLDL: 23 mg/dL (ref 0–40)

## 2014-04-29 NOTE — Assessment & Plan Note (Signed)
Will recheck his RPR 

## 2014-04-29 NOTE — Assessment & Plan Note (Signed)
He is doing well, has persistent low level bacteremia.  Will send ultra-deep sequncing. Will also check lipids.  He gets flu shot at work.  Offered/refuses condoms.  Will call him with results.

## 2014-04-29 NOTE — Progress Notes (Signed)
   Subjective:    Patient ID: Barry Zhang, male    DOB: 1965-12-16, 49 y.o.   MRN: [id number]  HPI 49 yo M with HIV+, leukopenia. Has been on atripla. Was previously eval for rash, due to ? Has since resolved. Believes it was syphilis. 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  Has had difficult year as his mom died of ovarian CA 11/14/2013.  Got labs at work 05-03-13 CD4 311 HIV RNA 100 . Glc 119.   Has questions about zoster vax.    Review of Systems     Objective:   Physical Exam  Constitutional: He appears well-developed and well-nourished.  HENT:  Mouth/Throat: No oropharyngeal exudate.  Eyes: EOM are normal. Pupils are equal, round, and reactive to light.  Neck: Neck supple.  Cardiovascular: Normal rate, regular rhythm and normal heart sounds.   Pulmonary/Chest: Effort normal and breath sounds normal.  Abdominal: Soft. Bowel sounds are normal. He exhibits no distension. There is no tenderness.  Lymphadenopathy:    He has no cervical adenopathy.        Assessment & Plan:

## 2014-04-30 ENCOUNTER — Telehealth: Payer: Self-pay | Admitting: *Deleted

## 2014-04-30 LAB — RPR: RPR Ser Ql: REACTIVE — AB

## 2014-04-30 LAB — RPR TITER

## 2014-04-30 LAB — FLUORESCENT TREPONEMAL AB(FTA)-IGG-BLD: FLUORESCENT TREPONEMAL ABS: REACTIVE — AB

## 2014-04-30 NOTE — Telephone Encounter (Signed)
Patient called, stating his insurance should cover the monogram testing, gave his authorization for it to be sent per Clydie BraunKaren.  Patient also inquiring about his lab results from yesterday. Please advise. Andree CossHowell, Aitan Rossbach M, RN

## 2014-04-30 NOTE — Telephone Encounter (Signed)
Labs show normal cholestrol, evidence of previous syphilis infection.  thanks

## 2014-04-30 NOTE — Telephone Encounter (Signed)
Will he need treatment for the syphilis result?

## 2014-05-01 NOTE — Telephone Encounter (Signed)
Already been treated thanks

## 2014-05-04 LAB — HLA B*5701: HLA-B 5701 W/RFLX HLA-B HIGH: NEGATIVE

## 2014-06-20 ENCOUNTER — Encounter: Payer: Self-pay | Admitting: Infectious Diseases

## 2014-07-09 ENCOUNTER — Other Ambulatory Visit: Payer: Self-pay | Admitting: Infectious Diseases

## 2014-08-19 ENCOUNTER — Encounter: Payer: Self-pay | Admitting: Infectious Diseases

## 2014-08-19 ENCOUNTER — Ambulatory Visit (INDEPENDENT_AMBULATORY_CARE_PROVIDER_SITE_OTHER): Payer: PRIVATE HEALTH INSURANCE | Admitting: Infectious Diseases

## 2014-08-19 VITALS — BP 139/81 | HR 72 | Temp 97.4°F | Wt 202.0 lb

## 2014-08-19 DIAGNOSIS — B20 Human immunodeficiency virus [HIV] disease: Secondary | ICD-10-CM

## 2014-08-19 DIAGNOSIS — A539 Syphilis, unspecified: Secondary | ICD-10-CM | POA: Diagnosis not present

## 2014-08-19 NOTE — Assessment & Plan Note (Addendum)
Will watch his HIV RNA. Not sure why still elevated.  If continues to be elevated will consider change to genvoya.  He is otherwise doing very well.  Offered/refused condoms.  Will see him back in 6 months.

## 2014-08-19 NOTE — Progress Notes (Signed)
   Subjective:    Patient ID: Barry Zhang, male    DOB: 07/13/1965, 49 y.o.   MRN: [id number]  HPI 49 yo M with HIV+, leukopenia. Has been on atripla. Was previously eval for rash, due to ? Has since resolved. Believes it was syphilis. 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  Has had difficult year as his mom died of ovarian CA June 2015.  Got labs at work 08-13-14 CD4 301 HIV RNA 530 . RPR 1:8.  Had genosure done in January. (R-RPV, SQV).   Work has been busy, stressful. Had just gotten over sinus infection when he had his blood work done. Had mild leukopenia 2.7, ANC 1500.  Still having trouble sleeping. Taking z-quell. Does not want ambien (due to mother sleep walking while on it).  Exercises at gym 3-5 days/week.   Review of Systems  Constitutional: Negative for fever, chills, appetite change and unexpected weight change.  Gastrointestinal: Negative for diarrhea and constipation.  Genitourinary: Negative for difficulty urinating.  Psychiatric/Behavioral: Positive for sleep disturbance.      Objective:   Physical Exam  Constitutional: He appears well-developed and well-nourished.  HENT:  Mouth/Throat: No oropharyngeal exudate.  Eyes: EOM are normal. Pupils are equal, round, and reactive to light.  Neck: Neck supple.  Cardiovascular: Normal rate, regular rhythm and normal heart sounds.   Pulmonary/Chest: Effort normal and breath sounds normal.  Abdominal: Soft. Bowel sounds are normal. He exhibits no distension. There is no tenderness.  Lymphadenopathy:    He has no cervical adenopathy.        Assessment & Plan:

## 2014-08-19 NOTE — Assessment & Plan Note (Signed)
His RPR continues to decrease.

## 2014-11-07 ENCOUNTER — Other Ambulatory Visit: Payer: Self-pay | Admitting: Infectious Diseases

## 2015-01-17 ENCOUNTER — Telehealth: Payer: Self-pay | Admitting: *Deleted

## 2015-01-17 NOTE — Telephone Encounter (Signed)
Left message asking the patient to call and schedule his 81month appointment. Andree Coss, RN

## 2015-03-05 ENCOUNTER — Other Ambulatory Visit: Payer: Self-pay | Admitting: Infectious Diseases

## 2015-03-06 ENCOUNTER — Telehealth: Payer: Self-pay | Admitting: *Deleted

## 2015-03-06 NOTE — Telephone Encounter (Signed)
Patient called stating he had labs done at his job in September and his viral load is now 40. He does not want to come in until January. Wendall MolaJacqueline Cockerham

## 2015-03-06 NOTE — Telephone Encounter (Signed)
Left message for the patient to call the office and schedule an appt asap.

## 2015-05-14 ENCOUNTER — Other Ambulatory Visit: Payer: PRIVATE HEALTH INSURANCE

## 2015-05-14 ENCOUNTER — Other Ambulatory Visit: Payer: Self-pay | Admitting: Infectious Diseases

## 2015-05-14 DIAGNOSIS — Z21 Asymptomatic human immunodeficiency virus [HIV] infection status: Secondary | ICD-10-CM

## 2015-05-14 DIAGNOSIS — Z79899 Other long term (current) drug therapy: Secondary | ICD-10-CM

## 2015-05-14 DIAGNOSIS — Z113 Encounter for screening for infections with a predominantly sexual mode of transmission: Secondary | ICD-10-CM

## 2015-05-14 LAB — CBC WITH DIFFERENTIAL/PLATELET
Basophils Absolute: 0 10*3/uL (ref 0.0–0.1)
Basophils Relative: 1 % (ref 0–1)
EOS ABS: 0.1 10*3/uL (ref 0.0–0.7)
EOS PCT: 2 % (ref 0–5)
HCT: 47.3 % (ref 39.0–52.0)
Hemoglobin: 16.4 g/dL (ref 13.0–17.0)
LYMPHS ABS: 1.1 10*3/uL (ref 0.7–4.0)
Lymphocytes Relative: 28 % (ref 12–46)
MCH: 32.3 pg (ref 26.0–34.0)
MCHC: 34.7 g/dL (ref 30.0–36.0)
MCV: 93.3 fL (ref 78.0–100.0)
MONOS PCT: 10 % (ref 3–12)
MPV: 11.3 fL (ref 8.6–12.4)
Monocytes Absolute: 0.4 10*3/uL (ref 0.1–1.0)
Neutro Abs: 2.3 10*3/uL (ref 1.7–7.7)
Neutrophils Relative %: 59 % (ref 43–77)
PLATELETS: 162 10*3/uL (ref 150–400)
RBC: 5.07 MIL/uL (ref 4.22–5.81)
RDW: 13 % (ref 11.5–15.5)
WBC: 3.9 10*3/uL — ABNORMAL LOW (ref 4.0–10.5)

## 2015-05-14 LAB — LIPID PANEL
CHOL/HDL RATIO: 4.4 ratio (ref <=5.0)
Cholesterol: 171 mg/dL (ref 125–200)
HDL: 39 mg/dL — ABNORMAL LOW (ref >=40)
LDL CALC: 106 mg/dL (ref <130)
Triglycerides: 130 mg/dL (ref <150)
VLDL: 26 mg/dL (ref <30)

## 2015-05-14 LAB — COMPREHENSIVE METABOLIC PANEL
ALT: 27 U/L (ref 9–46)
AST: 18 U/L (ref 10–40)
Albumin: 4.1 g/dL (ref 3.6–5.1)
Alkaline Phosphatase: 89 U/L (ref 40–115)
BILIRUBIN TOTAL: 0.3 mg/dL (ref 0.2–1.2)
BUN: 17 mg/dL (ref 7–25)
CO2: 26 mmol/L (ref 20–31)
CREATININE: 0.94 mg/dL (ref 0.60–1.35)
Calcium: 9 mg/dL (ref 8.6–10.3)
Chloride: 105 mmol/L (ref 98–110)
GLUCOSE: 96 mg/dL (ref 65–99)
Potassium: 4.1 mmol/L (ref 3.5–5.3)
SODIUM: 141 mmol/L (ref 135–146)
Total Protein: 6.5 g/dL (ref 6.1–8.1)

## 2015-05-15 LAB — RPR

## 2015-05-16 LAB — T-HELPER CELL (CD4) - (RCID CLINIC ONLY)
CD4 % Helper T Cell: 36 % (ref 33–55)
CD4 T CELL ABS: 420 /uL (ref 400–2700)

## 2015-05-16 LAB — HIV-1 RNA QUANT-NO REFLEX-BLD

## 2015-05-16 LAB — URINE CYTOLOGY ANCILLARY ONLY
Chlamydia: NEGATIVE
Neisseria Gonorrhea: NEGATIVE
Trichomonas: NEGATIVE

## 2015-05-28 ENCOUNTER — Ambulatory Visit (INDEPENDENT_AMBULATORY_CARE_PROVIDER_SITE_OTHER): Payer: PRIVATE HEALTH INSURANCE | Admitting: Infectious Diseases

## 2015-05-28 ENCOUNTER — Encounter: Payer: Self-pay | Admitting: Infectious Diseases

## 2015-05-28 VITALS — BP 135/85 | HR 78 | Temp 97.7°F | Wt 200.0 lb

## 2015-05-28 DIAGNOSIS — B20 Human immunodeficiency virus [HIV] disease: Secondary | ICD-10-CM

## 2015-05-28 DIAGNOSIS — Z79899 Other long term (current) drug therapy: Secondary | ICD-10-CM

## 2015-05-28 DIAGNOSIS — Z113 Encounter for screening for infections with a predominantly sexual mode of transmission: Secondary | ICD-10-CM

## 2015-05-28 NOTE — Progress Notes (Signed)
   Subjective:    Patient ID: Barry Zhang, male    DOB: 02-19-1966, 50 y.o.   MRN: [id number]  HPI 50 yo M with HIV+, leukopenia. Has been on atripla.   HIV 1 RNA QUANT (copies/mL)  Date Value  05/14/2015 <20  05/20/2010 <20 copies/mL  09/13/2007 935*   CD4 T CELL ABS  Date Value  05/14/2015 420 /uL  09/13/2007 250*  08/18/2007 140*   Goes to gym 4x/week. Is about 80% gluten free. Feels much better.  Sleep has been better. occas periods of insomnia.  occas episodes of less formed BM.   Review of Systems  Constitutional: Negative for appetite change and unexpected weight change.  Respiratory: Negative for shortness of breath.   Gastrointestinal: Negative for diarrhea and constipation.  Genitourinary: Negative for difficulty urinating.  Neurological: Negative for headaches.  Psychiatric/Behavioral: Negative for sleep disturbance.       Objective:   Physical Exam  Constitutional: He appears well-developed and well-nourished.  HENT:  Mouth/Throat: No oropharyngeal exudate.  Eyes: EOM are normal. Pupils are equal, round, and reactive to light.  Neck: Neck supple.  Cardiovascular: Normal rate, regular rhythm and normal heart sounds.   Pulmonary/Chest: Effort normal and breath sounds normal.  Abdominal: Soft. Bowel sounds are normal. There is no tenderness. There is no rebound.  Musculoskeletal: He exhibits no edema.       Assessment & Plan:

## 2015-05-28 NOTE — Assessment & Plan Note (Signed)
He is doing very well Offered to change him off atripla to newer art, he defers as he gets [place] for free Offered to set up colonoscopy, he defers.  He has gotten flu vax.  Will see him back in 1 year.

## 2015-07-03 ENCOUNTER — Other Ambulatory Visit: Payer: Self-pay | Admitting: *Deleted

## 2015-07-03 DIAGNOSIS — B20 Human immunodeficiency virus [HIV] disease: Secondary | ICD-10-CM

## 2015-07-03 MED ORDER — EFAVIRENZ-EMTRICITAB-TENOFOVIR 600-200-300 MG PO TABS: 1.0000 | ORAL_TABLET | Freq: Every day | ORAL | Status: DC

## 2015-09-26 ENCOUNTER — Other Ambulatory Visit: Payer: Self-pay | Admitting: Family Medicine

## 2015-09-27 LAB — CMP12+LP+TP+TSH+6AC+PSA+CBC…
A/G RATIO: 2.3 — AB (ref 1.2–2.2)
ALT: 29 IU/L (ref 0–44)
AST: 15 IU/L (ref 0–40)
Albumin: 4.2 g/dL (ref 3.5–5.5)
Alkaline Phosphatase: 83 IU/L (ref 39–117)
BASOS ABS: 0 10*3/uL (ref 0.0–0.2)
BUN/Creatinine Ratio: 19 (ref 9–20)
BUN: 18 mg/dL (ref 6–24)
Basos: 0 %
CREATININE: 0.97 mg/dL (ref 0.76–1.27)
Calcium: 8.4 mg/dL — ABNORMAL LOW (ref 8.7–10.2)
Chloride: 108 mmol/L — ABNORMAL HIGH (ref 96–106)
Chol/HDL Ratio: 3.6 ratio units (ref 0.0–5.0)
Cholesterol, Total: 196 mg/dL (ref 100–199)
EOS (ABSOLUTE): 0 10*3/uL (ref 0.0–0.4)
Eos: 1 %
Estimated CHD Risk: 0.6 times avg. (ref 0.0–1.0)
Free Thyroxine Index: 1.8 (ref 1.2–4.9)
GFR, EST AFRICAN AMERICAN: 106 mL/min/{1.73_m2} (ref >59)
GFR, EST NON AFRICAN AMERICAN: 91 mL/min/{1.73_m2} (ref >59)
GGT: 17 IU/L (ref 0–65)
GLUCOSE: 94 mg/dL (ref 65–99)
Globulin, Total: 1.8 g/dL (ref 1.5–4.5)
HDL: 55 mg/dL (ref >39)
Hematocrit: 45.4 % (ref 37.5–51.0)
Hemoglobin: 15.6 g/dL (ref 12.6–17.7)
IMMATURE GRANS (ABS): 0 10*3/uL (ref 0.0–0.1)
IMMATURE GRANULOCYTES: 0 %
IRON: 84 ug/dL (ref 38–169)
LDH: 199 IU/L (ref 121–224)
LDL Calculated: 123 mg/dL — ABNORMAL HIGH (ref 0–99)
LYMPHS: 20 %
Lymphocytes Absolute: 0.9 10*3/uL (ref 0.7–3.1)
MCH: 32.1 pg (ref 26.6–33.0)
MCHC: 34.4 g/dL (ref 31.5–35.7)
MCV: 93 fL (ref 79–97)
MONOCYTES: 10 %
Monocytes Absolute: 0.5 10*3/uL (ref 0.1–0.9)
NEUTROS PCT: 69 %
Neutrophils Absolute: 3.3 10*3/uL (ref 1.4–7.0)
PLATELETS: 126 10*3/uL — AB (ref 150–379)
Phosphorus: 3.4 mg/dL (ref 2.5–4.5)
Potassium: 4.3 mmol/L (ref 3.5–5.2)
Prostate Specific Ag, Serum: 0.6 ng/mL (ref 0.0–4.0)
RBC: 4.86 x10E6/uL (ref 4.14–5.80)
RDW: 13.6 % (ref 12.3–15.4)
Sodium: 144 mmol/L (ref 134–144)
T3 UPTAKE RATIO: 27 % (ref 24–39)
T4, Total: 6.7 ug/dL (ref 4.5–12.0)
TOTAL PROTEIN: 6 g/dL (ref 6.0–8.5)
TSH: 1.41 u[IU]/mL (ref 0.450–4.500)
Triglycerides: 90 mg/dL (ref 0–149)
URIC ACID: 5.2 mg/dL (ref 3.7–8.6)
VLDL CHOLESTEROL CAL: 18 mg/dL (ref 5–40)
WBC: 4.8 10*3/uL (ref 3.4–10.8)

## 2015-09-27 LAB — HGB A1C W/O EAG: Hgb A1c MFr Bld: 5.7 % — ABNORMAL HIGH (ref 4.8–5.6)

## 2016-01-01 ENCOUNTER — Other Ambulatory Visit: Payer: Self-pay | Admitting: Infectious Diseases

## 2016-01-01 DIAGNOSIS — B20 Human immunodeficiency virus [HIV] disease: Secondary | ICD-10-CM

## 2016-01-06 ENCOUNTER — Other Ambulatory Visit: Payer: Self-pay | Admitting: *Deleted

## 2016-01-06 DIAGNOSIS — B20 Human immunodeficiency virus [HIV] disease: Secondary | ICD-10-CM

## 2016-01-06 MED ORDER — EFAVIRENZ-EMTRICITAB-TENOFOVIR 600-200-300 MG PO TABS: 1.0000 | ORAL_TABLET | Freq: Every day | ORAL | 5 refills | Status: DC

## 2016-05-18 ENCOUNTER — Other Ambulatory Visit: Payer: PRIVATE HEALTH INSURANCE

## 2016-06-01 ENCOUNTER — Ambulatory Visit (INDEPENDENT_AMBULATORY_CARE_PROVIDER_SITE_OTHER): Payer: PRIVATE HEALTH INSURANCE | Admitting: Infectious Diseases

## 2016-06-01 ENCOUNTER — Encounter: Payer: Self-pay | Admitting: Infectious Diseases

## 2016-06-01 DIAGNOSIS — B20 Human immunodeficiency virus [HIV] disease: Secondary | ICD-10-CM | POA: Diagnosis not present

## 2016-06-01 DIAGNOSIS — R300 Dysuria: Secondary | ICD-10-CM

## 2016-06-01 DIAGNOSIS — Z113 Encounter for screening for infections with a predominantly sexual mode of transmission: Secondary | ICD-10-CM

## 2016-06-01 LAB — CBC
HCT: 47.8 % (ref 38.5–50.0)
HEMOGLOBIN: 16.3 g/dL (ref 13.2–17.1)
MCH: 31.5 pg (ref 27.0–33.0)
MCHC: 34.1 g/dL (ref 32.0–36.0)
MCV: 92.5 fL (ref 80.0–100.0)
MPV: 11.4 fL (ref 7.5–12.5)
Platelets: 146 10*3/uL (ref 140–400)
RBC: 5.17 MIL/uL (ref 4.20–5.80)
RDW: 13.5 % (ref 11.0–15.0)
WBC: 4.1 10*3/uL (ref 3.8–10.8)

## 2016-06-01 LAB — COMPREHENSIVE METABOLIC PANEL
ALBUMIN: 4 g/dL (ref 3.6–5.1)
ALT: 30 U/L (ref 9–46)
AST: 21 U/L (ref 10–35)
Alkaline Phosphatase: 82 U/L (ref 40–115)
BUN: 14 mg/dL (ref 7–25)
CO2: 28 mmol/L (ref 20–31)
CREATININE: 1.06 mg/dL (ref 0.70–1.33)
Calcium: 9.1 mg/dL (ref 8.6–10.3)
Chloride: 105 mmol/L (ref 98–110)
Glucose, Bld: 101 mg/dL — ABNORMAL HIGH (ref 65–99)
Potassium: 4.3 mmol/L (ref 3.5–5.3)
SODIUM: 141 mmol/L (ref 135–146)
TOTAL PROTEIN: 6.3 g/dL (ref 6.1–8.1)
Total Bilirubin: 0.3 mg/dL (ref 0.2–1.2)
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
## 2016-06-01 NOTE — Assessment & Plan Note (Signed)
Will send him to lab Offered/refused condoms.  Spoke at length regarding colon, "maybe when I am 55" Offered him newer ART. Would like to stay on atripla. We discussed risk of kidney injury, bone damage.  Will see him back in 1 year.

## 2016-06-01 NOTE — Addendum Note (Signed)
Addended by: Mariea ClontsGREEN, Kristian Mogg D on: 06/01/2016 05:01 PM   Modules accepted: Orders

## 2016-06-02 LAB — RPR: RPR: REACTIVE — AB

## 2016-06-02 LAB — RPR TITER

## 2016-06-02 LAB — FLUORESCENT TREPONEMAL AB(FTA)-IGG-BLD: FLUORESCENT TREPONEMAL ABS: REACTIVE — AB

## 2016-06-03 LAB — T-HELPER CELL (CD4) - (RCID CLINIC ONLY)
CD4 % Helper T Cell: 39 % (ref 33–55)
CD4 T CELL ABS: 500 /uL (ref 400–2700)

## 2016-06-03 LAB — URINE CYTOLOGY ANCILLARY ONLY
CHLAMYDIA, DNA PROBE: NEGATIVE
NEISSERIA GONORRHEA: NEGATIVE

## 2016-06-04 LAB — HIV-1 RNA QUANT-NO REFLEX-BLD
HIV 1 RNA Quant: <20 copies/mL
HIV-1 RNA Quant, Log: <1.3 Log copies/mL

## 2016-07-29 ENCOUNTER — Other Ambulatory Visit: Payer: Self-pay | Admitting: Infectious Diseases

## 2016-07-29 DIAGNOSIS — B20 Human immunodeficiency virus [HIV] disease: Secondary | ICD-10-CM

## 2016-09-02 ENCOUNTER — Ambulatory Visit: Payer: Self-pay | Admitting: Registered Nurse

## 2016-09-02 VITALS — BP 122/89 | HR 62 | Temp 97.4°F

## 2016-09-02 DIAGNOSIS — L255 Unspecified contact dermatitis due to plants, except food: Secondary | ICD-10-CM

## 2016-09-02 MED ORDER — PREDNISONE 10 MG (21) PO TBPK: ORAL_TABLET | ORAL | 0 refills | Status: DC

## 2016-09-02 NOTE — Progress Notes (Signed)
Subjective:    Patient ID: Barry Zhang, male    DOB: 01/16/1966, 51 y.o.   MRN: [id number]  50y/o caucasian male  exposed to poison oak 3 days ago. Now with itchy red rash to L FA, Bilateral upper legs and R neck. Has not tried any home remedies except shower.  Rash started on forearm and has been spreading.  Typically occurs yearly with spring yard work.  Last year 60mg  taper over 12 days cleared rash.  Did wear gloves/long sleeves/pants.  Rash seems worse than usual.  Denied fever/chills/purulent discharge, nausea/vomiting/headache, dyspnea, dysphagia.      Review of Systems  Constitutional: Negative for activity change, appetite change, chills and fever.  HENT: Negative for congestion, ear pain and sore throat.   Eyes: Negative for pain and discharge.  Respiratory: Negative for cough and wheezing.   Cardiovascular: Negative for chest pain and leg swelling.  Gastrointestinal: Negative for blood in stool, constipation, diarrhea, nausea and vomiting.  Genitourinary: Negative for difficulty urinating, dysuria and hematuria.  Musculoskeletal: Negative for arthralgias, back pain and myalgias.  Skin: Positive for color change and rash.  Allergic/Immunologic: Negative for environmental allergies and food allergies.  Neurological: Negative for headaches.  Hematological: Negative for adenopathy. Does not bruise/bleed easily.  Psychiatric/Behavioral: Positive for sleep disturbance. Negative for agitation and confusion. The patient is not nervous/anxious.        Objective:   Physical Exam  Constitutional: He is oriented to person, place, and time. Vital signs are normal. He appears well-developed and well-nourished.  Non-toxic appearance. He does not have a sickly appearance. He does not appear ill. No distress.  HENT:  Head: Normocephalic and atraumatic.  Right Ear: Hearing, external ear and ear canal normal. A middle ear effusion is present.  Left Ear: Hearing, external ear and ear  canal normal. A middle ear effusion is present.  Nose: Nose normal. No mucosal edema, rhinorrhea, nose lacerations, sinus tenderness, nasal deformity, septal deviation or nasal septal hematoma. No epistaxis.  No foreign bodies. Right sinus exhibits no maxillary sinus tenderness and no frontal sinus tenderness. Left sinus exhibits no maxillary sinus tenderness and no frontal sinus tenderness.  Mouth/Throat: Uvula is midline and mucous membranes are normal. Mucous membranes are not pale, not dry and not cyanotic. He does not have dentures. No oral lesions. No trismus in the jaw. Normal dentition. No dental abscesses, uvula swelling, lacerations or dental caries. Posterior oropharyngeal edema and posterior oropharyngeal erythema present. No oropharyngeal exudate or tonsillar abscesses.  Cobblestoning posterior pharynx; bilateral TMs air fluid level clear; bilateral allergic shiners  Eyes: Conjunctivae, EOM and lids are normal. Pupils are equal, round, and reactive to light. Right eye exhibits no discharge. Left eye exhibits no discharge. No scleral icterus.  Neck: Trachea normal, normal range of motion and phonation normal. Neck supple. No tracheal tenderness, no spinous process tenderness and no muscular tenderness present. No neck rigidity. Erythema present. No tracheal deviation, no edema and normal range of motion present. No thyromegaly present.    Macular papular rash on erythematous base 5x7cm grouped right anteriolateral neck/shoulder dry  Cardiovascular: Normal rate, regular rhythm, normal heart sounds and intact distal pulses.   Pulses:      Radial pulses are 2+ on the right side, and 2+ on the left side.  Pulmonary/Chest: Effort normal and breath sounds normal. No accessory muscle usage or stridor. No respiratory distress. He has no decreased breath sounds. He has no wheezes. He has no rhonchi. He has no rales. He  exhibits no tenderness.  Abdominal: Soft. He exhibits no distension. There is no  guarding.  Musculoskeletal: Normal range of motion. He exhibits edema and tenderness. He exhibits no deformity.       Right shoulder: Normal.       Left shoulder: Normal.       Right elbow: Normal.      Left elbow: Normal.       Right wrist: Normal.       Left wrist: Normal.       Right hip: Normal.       Left hip: Normal.       Right knee: Normal.       Left knee: Normal.       Right ankle: Normal.       Left ankle: Normal.       Cervical back: He exhibits swelling. He exhibits normal range of motion, no tenderness, no bony tenderness, no edema, no deformity, no laceration, no pain, no spasm and normal pulse.       Thoracic back: Normal.       Lumbar back: Normal.       Right upper arm: Normal.       Left upper arm: Normal.       Right forearm: Normal.       Left forearm: He exhibits tenderness and swelling. He exhibits no bony tenderness, no edema, no deformity and no laceration.       Arms:      Right hand: Normal.       Left hand: Normal.       Right upper leg: He exhibits tenderness.       Left upper leg: He exhibits tenderness.       Right lower leg: Normal.       Left lower leg: Normal.       Legs: Rash macular papular at marked areas slightly tender more itchy if swelling nonpitting 0-1+/4  Lymphadenopathy:       Head (right side): No submental, no submandibular, no tonsillar, no preauricular, no posterior auricular and no occipital adenopathy present.       Head (left side): No submental, no submandibular, no tonsillar, no preauricular, no posterior auricular and no occipital adenopathy present.    He has no cervical adenopathy.       Right cervical: No superficial cervical, no deep cervical and no posterior cervical adenopathy present.      Left cervical: No superficial cervical, no deep cervical and no posterior cervical adenopathy present.  Neurological: He is alert and oriented to person, place, and time. He is not disoriented. He displays no atrophy and no tremor. No  cranial nerve deficit or sensory deficit. He exhibits normal muscle tone. He displays no seizure activity. Coordination and gait normal. GCS eye subscore is 4. GCS verbal subscore is 5. GCS motor subscore is 6.  On/off exam table; in/out of chair without difficulty; gait sure and steady in hallway  Skin: Skin is warm, dry and intact. Rash noted. No abrasion, no bruising, no burn, no ecchymosis, no laceration, no lesion, no petechiae and no purpura noted. Rash is macular, papular and maculopapular. Rash is not nodular, not pustular, not vesicular and not urticarial. He is not diaphoretic. There is erythema. No cyanosis. No pallor. Nails show no clubbing.     Psychiatric: He has a normal mood and affect. His speech is normal and behavior is normal. Judgment and thought content normal. Cognition and memory are normal.  Nursing note  and vitals reviewed.         Assessment & Plan:  A-contact dermatitis due to plant  P-Has had poison ivy/oak in the past.  Typically 60mg  taper over 2 weeks sufficient.  Dispensed prednisone from [facility] to patient.  Widespread greater than 20% body surface area.  1-2mg /kg Prednisone (max 60mg ) for 7-10 days and taper over next 7-10 days per Up to Date.  Symptomatic therapy suggested e.g. Calamine lotion, benadryl or OTC zyrtec 10mg  po BID prn.  Warm to cool water soaks and/or oatmeal baths.  Call or return to clinic as needed if these symptoms worsen or fail to improve as anticipated especially lesions noted on eye, visual changes or visual loss.  Discussed avoidance/no contact wear of long sleeves/pants/socks/gloves and handkerchief around neck/mouth/face and use of poison ivy block cream along with tepid shower immediately after completion of yard work/playing in yard.  Keep poison ivy block lotion and soap at home as exposure likely to occur again.  Mother has already provided education to son on identifying plant in multiple seasons and known location in yard this weekend.   Avoid scratching lesions to prevent secondary infections.  May apply ice to itchy areas if po/topical meds not yet active systemically or wearing off prior to next dose.  Exitcare handout on contact dermatitis poison ivy/oak and cellulitis given to patient.  Patient verbalized agreement and understanding of treatment plan and had no further questions at this time.   P2:  Avoidance and hand washing.

## 2016-09-02 NOTE — Patient Instructions (Addendum)
Prednisone taper x 12 days days 1 & 2 60mg /6 pills then 5 pills x 2 days then 4 pills x 2 days then 3 pills x 2 days then 2 pills x 2 days then 1 pill x 2 days with breakfast Do not scratch May take zyrtec 10mg  by mouth twice a day as needed for itching and/or apply ice for 15 minutes three times per day Monitor for worsening redness/pain/swelling/purulent discharge/fever as these are signs of infection need to be re-evaluated Sometimes may need 3 weeks of steroids if rash improving but not resolved follow up for re-evaluation also Consider blocking lotion at wrists/ankles/waist; discussed wearing gloves before handling lawn equipment as may be contaminated with oils along with handles/doorknobs Washing laundry more frequently   Poison Ivy Dermatitis Poison ivy dermatitis is inflammation of the skin that is caused by the allergens on the leaves of the poison ivy plant. The skin reaction often involves redness, swelling, blisters, and extreme itching. What are the causes? This condition is caused by a specific chemical (urushiol) found in the sap of the poison ivy plant. This chemical is sticky and can be easily spread to people, animals, and objects. You can get poison ivy dermatitis by:  Having direct contact with a poison ivy plant.  Touching animals, other people, or objects that have come in contact with poison ivy and have the chemical on them. What increases the risk? This condition is more likely to develop in:  People who are outdoors often.  People who go outdoors without wearing protective clothing, such as closed shoes, long pants, and a long-sleeved shirt. What are the signs or symptoms? Symptoms of this condition include:  Redness and itching.  A rash that often includes bumps and blisters. The rash usually appears 48 hours after exposure.  Swelling. This may occur if the reaction is more severe. Symptoms usually last for 1-2 weeks. However, the first time you develop this  condition, symptoms may last 3-4 weeks. How is this diagnosed? This condition may be diagnosed based on your symptoms and a physical exam. Your health care provider may also ask you about any recent outdoor activity. How is this treated? Treatment for this condition will vary depending on how severe it is. Treatment may include:  Hydrocortisone creams or calamine lotions to relieve itching.  Oatmeal baths to soothe the skin.  Over-the-counter antihistamine tablets.  Oral steroid medicine for more severe outbreaks. Follow these instructions at home:  Take or apply over-the-counter and prescription medicines only as told by your health care provider.  Wash exposed skin as soon as possible with soap and cold water.  Use hydrocortisone creams or calamine lotion as needed to soothe the skin and relieve itching.  Take oatmeal baths as needed. Use colloidal oatmeal. You can get this at your local pharmacy or grocery store. Follow the instructions on the packaging.  Do not scratch or rub your skin.  While you have the rash, wash clothes right after you wear them. How is this prevented?  Learn to identify the poison ivy plant and avoid contact with the plant. This plant can be recognized by the number of leaves. Generally, poison ivy has three leaves with flowering branches on a single stem. The leaves are typically glossy, and they have jagged edges that come to a point at the front.  If you have been exposed to poison ivy, thoroughly wash with soap and water right away. You have about 30 minutes to remove the plant resin before it  will cause the rash. Be sure to wash under your fingernails because any plant resin there will continue to spread the rash.  When hiking or camping, wear clothes that will help you to avoid exposure on the skin. This includes long pants, a long-sleeved shirt, tall socks, and hiking boots. You can also apply preventive lotion to your skin to help limit  exposure.  If you suspect that your clothes or outdoor gear came in contact with poison ivy, rinse them off outside with a garden hose before you bring them inside your house. Contact a health care provider if:  You have open sores in the rash area.  You have more redness, swelling, or pain in the affected area.  You have redness that spreads beyond the rash area.  You have fluid, blood, or pus coming from the affected area.  You have a fever.  You have a rash over a large area of your body.  You have a rash on your eyes, mouth, or genitals.  Your rash does not improve after a few days. Get help right away if:  Your face swells or your eyes swell shut.  You have trouble breathing.  You have trouble swallowing. This information is not intended to replace advice given to you by your health care provider. Make sure you discuss any questions you have with your health care provider. Document Released: 04/09/2000 Document Revised: 09/18/2015 Document Reviewed: 09/18/2014 Elsevier Interactive Patient Education  2017 [employer]   Cellulitis, Adult Cellulitis is a skin infection. The infected area is usually red and tender. This condition occurs most often in the arms and lower legs. The infection can travel to the muscles, blood, and underlying tissue and become serious. It is very important to get treated for this condition. What are the causes? Cellulitis is caused by bacteria. The bacteria enter through a break in the skin, such as a cut, burn, insect bite, open sore, or crack. What increases the risk? This condition is more likely to occur in people who:  Have a weak defense system (immune system).  Have open wounds on the skin such as cuts, burns, bites, and scrapes. Bacteria can enter the body through these open wounds.  Are older.  Have diabetes.  Have a type of long-lasting (chronic) liver disease (cirrhosis) or kidney disease.  Use IV drugs. What are the signs  or symptoms? Symptoms of this condition include:  Redness, streaking, or spotting on the skin.  Swollen area of the skin.  Tenderness or pain when an area of the skin is touched.  Warm skin.  Fever.  Chills.  Blisters. How is this diagnosed? This condition is diagnosed based on a medical history and physical exam. You may also have tests, including:  Blood tests.  Lab tests.  Imaging tests. How is this treated? Treatment for this condition may include:  Medicines, such as antibiotic medicines or antihistamines.  Supportive care, such as rest and application of cold or warm cloths (cold or warm compresses) to the skin.  Hospital care, if the condition is severe. The infection usually gets better within 1-2 days of treatment. Follow these instructions at home:  Take over-the-counter and prescription medicines only as told by your health care provider.  If you were prescribed an antibiotic medicine, take it as told by your health care provider. Do not stop taking the antibiotic even if you start to feel better.  Drink enough fluid to keep your urine clear or pale yellow.  Do not  touch or rub the infected area.  Raise (elevate) the infected area above the level of your heart while you are sitting or lying down.  Apply warm or cold compresses to the affected area as told by your health care provider.  Keep all follow-up visits as told by your health care provider. This is important. These visits let your health care provider make sure a more serious infection is not developing. Contact a health care provider if:  You have a fever.  Your symptoms do not improve within 1-2 days of starting treatment.  Your bone or joint underneath the infected area becomes painful after the skin has healed.  Your infection returns in the same area or another area.  You notice a swollen bump in the infected area.  You develop new symptoms.  You have a general ill feeling  (malaise) with muscle aches and pains. Get help right away if:  Your symptoms get worse.  You feel very sleepy.  You develop vomiting or diarrhea that persists.  You notice red streaks coming from the infected area.  Your red area gets larger or turns dark in color. This information is not intended to replace advice given to you by your health care provider. Make sure you discuss any questions you have with your health care provider. Document Released: 01/20/2005 Document Revised: 08/21/2015 Document Reviewed: 02/19/2015 Elsevier Interactive Patient Education  2017 [employer].   Poison Oak Dermatitis Poison oak dermatitis is inflammation of the skin that is caused by contact with the allergens on the leaves of the poison oak (toxicodendron) plant. The skin reaction often includes redness, swelling, blisters, and extreme itching. What are the causes? This condition is caused by a specific chemical (urushiol) that is found in the sap of the poison oak plant. This chemical is sticky and it can be easily spread to people, animals, and objects. You can get poison oak dermatitis by:  Having direct contact with a poison oak plant.  Touching animals, other people, or objects that have come in contact with poison oak and have the chemical on them. What increases the risk? This condition is more likely to develop in people who:  Are outdoors often.  Go outdoors without wearing protective clothing, such as closed shoes, long pants, and a long-sleeved shirt. What are the signs or symptoms? Symptoms of this condition include:  Redness of the skin.  A rash that may develop blisters.  Extreme itching.  Swelling. This may occur if the reaction is more severe. Symptoms usually last for 1-2 weeks. However, the first time you develop this condition, symptoms may last 3-4 weeks. How is this diagnosed? This condition may be diagnosed based on your symptoms and a physical exam. Your health  care provider may also ask you about any recent outdoor activity. How is this treated? Treatment for this condition will vary depending on how severe it is. Treatment may include:  Hydrocortisone creams or calamine lotions to relieve itching.  Oatmeal baths to soothe the skin.  Over-the-counter antihistamine tablets.  Oral steroid medicine for more severe outbreaks. Follow these instructions at home:  Take or apply over-the-counter and prescription medicines only as told by your health care provider.  Wash exposed skin as soon as possible with soap and cold water.  Use hydrocortisone creams or calamine lotion as needed to soothe the skin and relieve itching.  Take oatmeal baths as needed. Use colloidal oatmeal. You can get this at your local pharmacy or grocery store. Follow the  instructions on the packaging.  Do not scratch or rub your skin.  While you have the rash, wash clothes right after you wear them. How is this prevented?  Learn to identify the poison oak plant and avoid contact with the plant. This plant can be recognized by the number of leaves. Generally, poison oak has three leaves with flowering branches on a single stem. The leaves are often a bit fuzzy and have a toothlike edge.  If you have been exposed to poison oak, thoroughly wash with soap and water right away. You have about 30 minutes to remove the plant resin before it will cause the rash. Be sure to wash under your fingernails because any plant resin there will continue to spread the rash.  When hiking or camping, wear clothes that will help you avoid exposure on the skin. This includes long pants, a long-sleeved shirt, tall socks, and hiking boots. You can also apply preventive lotion to your skin to help limit exposure.  If you suspect that your clothes or outdoor gear came in contact with poison oak, rinse them off outside with a garden hose before bringing them inside your house. Contact a health care  provider if:  You have open sores in the rash area.  You have more redness, swelling, or pain in the affected area.  You have redness that spreads beyond the rash area.  You have fluid, blood, or pus coming from the affected area.  You have a fever.  You have a rash over a large area of your body.  You have a rash on your eyes, mouth, or genitals.  Your rash does not improve after a few days. Get help right away if:  Your face swells or your eyes swell shut.  You have trouble breathing.  You have trouble swallowing. This information is not intended to replace advice given to you by your health care provider. Make sure you discuss any questions you have with your health care provider. Document Released: 10/17/2002 Document Revised: 09/18/2015 Document Reviewed: 09/18/2014 Elsevier Interactive Patient Education  2017 [employer].

## 2016-10-18 ENCOUNTER — Ambulatory Visit: Payer: Self-pay | Admitting: *Deleted

## 2016-10-18 VITALS — BP 112/88 | HR 77 | Ht 69.0 in | Wt 202.0 lb

## 2016-10-18 DIAGNOSIS — Z0000 Encounter for general adult medical examination without abnormal findings: Secondary | ICD-10-CM

## 2016-10-18 NOTE — Progress Notes (Signed)
[employer] insurance premium discount evaluation: Labs Drawn. Replacements ROI form signed. Tobacco Free Attestation form signed.  Forms placed in paper chart.  

## 2016-10-19 LAB — CMP12+LP+TP+TSH+6AC+PSA+CBC…
ALBUMIN: 4.1 g/dL (ref 3.5–5.5)
ALK PHOS: 96 IU/L (ref 39–117)
ALT: 23 IU/L (ref 0–44)
AST: 20 IU/L (ref 0–40)
Albumin/Globulin Ratio: 1.8 (ref 1.2–2.2)
BASOS: 0 %
BUN / CREAT RATIO: 20 (ref 9–20)
BUN: 21 mg/dL (ref 6–24)
Basophils Absolute: 0 10*3/uL (ref 0.0–0.2)
Bilirubin Total: 0.3 mg/dL (ref 0.0–1.2)
CALCIUM: 8.6 mg/dL — AB (ref 8.7–10.2)
CHLORIDE: 107 mmol/L — AB (ref 96–106)
CREATININE: 1.07 mg/dL (ref 0.76–1.27)
Chol/HDL Ratio: 3.7 ratio (ref 0.0–5.0)
Cholesterol, Total: 175 mg/dL (ref 100–199)
EOS (ABSOLUTE): 0.1 10*3/uL (ref 0.0–0.4)
EOS: 2 %
ESTIMATED CHD RISK: 0.6 times avg. (ref 0.0–1.0)
Free Thyroxine Index: 1.5 (ref 1.2–4.9)
GFR calc Af Amer: 92 mL/min/{1.73_m2} (ref >59)
GFR calc non Af Amer: 80 mL/min/{1.73_m2} (ref >59)
GGT: 16 IU/L (ref 0–65)
Globulin, Total: 2.3 g/dL (ref 1.5–4.5)
Glucose: 86 mg/dL (ref 65–99)
HDL: 47 mg/dL (ref >39)
HEMATOCRIT: 45.9 % (ref 37.5–51.0)
HEMOGLOBIN: 16 g/dL (ref 13.0–17.7)
IMMATURE GRANULOCYTES: 0 %
Immature Grans (Abs): 0 10*3/uL (ref 0.0–0.1)
Iron: 90 ug/dL (ref 38–169)
LDH: 221 IU/L (ref 121–224)
LDL CALC: 113 mg/dL — AB (ref 0–99)
LYMPHS ABS: 0.9 10*3/uL (ref 0.7–3.1)
Lymphs: 28 %
MCH: 32.2 pg (ref 26.6–33.0)
MCHC: 34.9 g/dL (ref 31.5–35.7)
MCV: 92 fL (ref 79–97)
Monocytes Absolute: 0.4 10*3/uL (ref 0.1–0.9)
Monocytes: 12 %
NEUTROS ABS: 1.8 10*3/uL (ref 1.4–7.0)
Neutrophils: 58 %
POTASSIUM: 4.4 mmol/L (ref 3.5–5.2)
Phosphorus: 2.3 mg/dL — ABNORMAL LOW (ref 2.5–4.5)
Platelets: 135 10*3/uL — ABNORMAL LOW (ref 150–379)
Prostate Specific Ag, Serum: 0.7 ng/mL (ref 0.0–4.0)
RBC: 4.97 x10E6/uL (ref 4.14–5.80)
RDW: 14 % (ref 12.3–15.4)
SODIUM: 144 mmol/L (ref 134–144)
T3 Uptake Ratio: 21 % — ABNORMAL LOW (ref 24–39)
T4 TOTAL: 7 ug/dL (ref 4.5–12.0)
TSH: 1.05 u[IU]/mL (ref 0.450–4.500)
Total Protein: 6.4 g/dL (ref 6.0–8.5)
Triglycerides: 77 mg/dL (ref 0–149)
Uric Acid: 6.3 mg/dL (ref 3.7–8.6)
VLDL Cholesterol Cal: 15 mg/dL (ref 5–40)
WBC: 3.1 10*3/uL — ABNORMAL LOW (ref 3.4–10.8)

## 2016-10-19 LAB — HGB A1C W/O EAG: HEMOGLOBIN A1C: 5.2 % (ref 4.8–5.6)

## 2016-11-02 NOTE — Progress Notes (Signed)
RN reviewed results with pt and provided pt with copy on 6/26. Instructed to f/u with ID/PCP re: decreased WBC and platelets from previous. Instructed to increase dietary intake of calcium, phosphorus. Results routed to Dr. Ninetta LightsHatcher per pt request. Pt would like to plan on repeat of labs in 3 months. Asked pt to contact RN around that time and RN will request orders from ID for any specific labs that may need at that time. Pt agreeable to this. No further questions/concerns.

## 2016-11-11 ENCOUNTER — Ambulatory Visit: Payer: Self-pay | Admitting: Registered Nurse

## 2016-11-11 VITALS — Temp 97.8°F

## 2016-11-11 DIAGNOSIS — B359 Dermatophytosis, unspecified: Secondary | ICD-10-CM

## 2016-11-11 DIAGNOSIS — B20 Human immunodeficiency virus [HIV] disease: Secondary | ICD-10-CM

## 2016-11-11 MED ORDER — CLOTRIMAZOLE-BETAMETHASONE 1-0.05 % EX CREA: 1.0000 "application " | TOPICAL_CREAM | Freq: Two times a day (BID) | CUTANEOUS | 0 refills | Status: DC

## 2016-11-11 NOTE — Patient Instructions (Signed)
Jock Itch Jock itch (tinea cruris) is a fungal infection of the skin in the groin area. It is sometimes called ringworm, even though it is not caused by worms. It is caused by a fungus, which is a type of germ that thrives in dark, damp places. Jock itch causes a rash and itching in the groin and upper thigh area. It usually goes away in 2-3 weeks with treatment. What are the causes? The fungus that causes jock itch may be spread by:  Touching a fungus infection elsewhere on your body-such as athlete's foot-and then touching your groin area.  Sharing towels or clothing with an infected person.  What increases the risk? Jock itch is most common in men and adolescent boys. This condition is more likely to develop from:  Being in hot, humid climates.  Wearing tight-fitting clothing or wet bathing suits for long periods of time.  Participating in sports.  Being overweight.  Having diabetes.  What are the signs or symptoms? Symptoms of jock itch may include:  A red, pink, or brown rash in the groin area. The rash may spread to the thighs, anus, and buttocks.  Dry and scaly skin on or around the rash.  Itchiness.  How is this diagnosed? Most often, a health care provider can make the diagnosis by looking at your rash. Sometimes, a scraping of the infected skin will be taken. This sample may be tested by looking at it under a microscope or by trying to grow the fungus from the sample (culture). How is this treated? Treatment for this condition may include:  Antifungal medicine to kill the fungus. This may be in various forms: ? Skin cream or ointment. ? Medicine taken by mouth.  Skin cream or ointment to reduce the itching.  Compresses or medicated powders to dry the infected skin.  Follow these instructions at home:  Take medicines only as directed by your health care provider. Apply skin creams or ointments exactly as directed.  Wear loose-fitting clothing. ? Men should  wear cotton boxer shorts. ? Women should wear cotton underwear.  Change your underwear every day to keep your groin dry.  Avoid hot baths.  Dry your groin area well after bathing. ? Use a separate towel to dry your groin area. This will help to prevent a spreading of the infection to other areas of your body.  Do not scratch the affected area.  Do not share towels with other people. Contact a health care provider if:  Your rash does not improve or it gets worse after 2 weeks of treatment.  Your rash is spreading.  Your rash returns after treatment is finished.  You have a fever.  You have redness, swelling, or pain in the area around your rash.  You have fluid, blood, or pus coming from your rash.  Your have your rash for more than 4 weeks. This information is not intended to replace advice given to you by your health care provider. Make sure you discuss any questions you have with your health care provider. Document Released: 04/02/2002 Document Revised: 09/18/2015 Document Reviewed: 01/22/2014 Elsevier Interactive Patient Education  2018 Elsevier Inc.  

## 2016-11-11 NOTE — Progress Notes (Signed)
Subjective:    Patient ID: Barry Zhang, male    DOB: 07/26/1965, 51 y.o.   MRN: [id number]  51y/o caucasian male established patient requesting refill of lotrisone [place] and rash reoccurred waistband/groin after spending weekend at beach sweating "not going back this weekend temperatures too hot"  Heat index 100s.  Patient reports he typically only needs to apply twice a day for two days and rash resolves has had last tube for a couple of years last Rx from [facility].  Denied fever/chills/purulent discharge.  Slightly itchy.      Review of Systems  Constitutional: Positive for diaphoresis. Negative for activity change, appetite change, chills and fever.  HENT: Negative for ear discharge and trouble swallowing.   Eyes: Negative for pain and discharge.  Respiratory: Negative for cough and wheezing.   Cardiovascular: Negative for leg swelling.  Gastrointestinal: Negative for blood in stool and vomiting.  Endocrine: Positive for heat intolerance. Negative for cold intolerance.  Genitourinary: Negative for difficulty urinating, dysuria and hematuria.  Musculoskeletal: Negative for arthralgias, back pain and myalgias.  Skin: Positive for color change and rash. Negative for pallor and wound.  Allergic/Immunologic: Positive for environmental allergies. Negative for food allergies.  Neurological: Negative for headaches.  Hematological: Negative for adenopathy. Does not bruise/bleed easily.  Psychiatric/Behavioral: Negative for agitation, confusion and sleep disturbance. The patient is not nervous/anxious.        Objective:   Physical Exam  Constitutional: He is oriented to person, place, and time. Vital signs are normal. He appears well-developed and well-nourished. He is active.  Non-toxic appearance. He does not have a sickly appearance. He does not appear ill. No distress.  HENT:  Head: Normocephalic and atraumatic.  Right Ear: Hearing and external ear  normal.  Left Ear: Hearing and external ear normal.  Nose: Nose normal.  Mouth/Throat: Uvula is midline, oropharynx is clear and moist and mucous membranes are normal. No oropharyngeal exudate.  Eyes: Pupils are equal, round, and reactive to light. Conjunctivae, EOM and lids are normal. Right eye exhibits no discharge. Left eye exhibits no discharge.  Neck: Trachea normal, normal range of motion and phonation normal. Neck supple. No neck rigidity. No tracheal deviation, no edema, no erythema and normal range of motion present. No thyromegaly present.  Cardiovascular: Normal rate, regular rhythm and intact distal pulses.   Pulmonary/Chest: Effort normal and breath sounds normal. No stridor. No respiratory distress. He has no wheezes.  Abdominal: Soft. He exhibits no distension. There is no guarding.  Musculoskeletal: He exhibits deformity. He exhibits no edema or tenderness.       Right shoulder: Normal.       Left shoulder: Normal.       Right elbow: Normal.      Left elbow: Normal.       Right hip: Normal.       Left hip: Normal.       Right knee: Normal.       Left knee: Normal.       Cervical back: Normal.       Right hand: Normal.       Left hand: Normal.  Lymphadenopathy:    He has no cervical adenopathy.  Neurological: He is alert and oriented to person, place, and time. He is not disoriented. He displays no atrophy and no tremor. No cranial nerve deficit or sensory deficit. He exhibits normal muscle tone. He displays no seizure activity. Coordination and gait normal. GCS eye subscore  is 4. GCS verbal subscore is 5. GCS motor subscore is 6.  Gait sure and steady in clinic; in/out of chair without difficulty  Skin: Skin is warm, dry and intact. Rash noted. No abrasion, no bruising, no burn, no ecchymosis, no laceration, no lesion, no petechiae and no purpura noted. Rash is macular, papular and maculopapular. Rash is not nodular, not pustular, not vesicular and not urticarial. He is not  diaphoretic. There is erythema. No cyanosis. No pallor. Nails show no clubbing.     Grouped papules on erythematous base and 3 1cm linear abrasions fine brown scab adjacent to grouped papules  Psychiatric: He has a normal mood and affect. His speech is normal and behavior is normal. Judgment and thought content normal. Cognition and memory are normal.          Assessment & Plan:  A-tinea cruris  P-refilled lotrisone1-0.05 apply BID affected area x 1 week #1 RF0 Medication as directed. Electronic Rx to pharmacy of choice  Symptomatic therapy suggested. Change sweaty clothes/wet clothes do not sit for in long periods e.g. Hours.  May use blowdryer and/or towel to dry off.  Avoid scratching to prevent secondary infection.  May use ice and/or zyrtec 10mg  po daily, calamine lotion, benadryl topical prn itching.  Call or return to clinic as needed if these symptoms worsen or fail to improve as anticipated.  Exitcare handout on tinea cruris given to patient.  Patient verbalized agreement and understanding of treatment plan and had no further questions at this time.   P2:  Avoidance and hand washing.

## 2017-01-25 ENCOUNTER — Other Ambulatory Visit: Payer: Self-pay | Admitting: Infectious Diseases

## 2017-01-25 DIAGNOSIS — B20 Human immunodeficiency virus [HIV] disease: Secondary | ICD-10-CM

## 2017-04-07 ENCOUNTER — Ambulatory Visit: Payer: Self-pay | Admitting: *Deleted

## 2017-04-07 VITALS — BP 132/93 | HR 74

## 2017-04-07 DIAGNOSIS — R1033 Periumbilical pain: Secondary | ICD-10-CM

## 2017-04-07 NOTE — Progress Notes (Signed)
Pt reports sudden onset of periumbilical abd pain 1.5 weeks ago. At time of onset, he assumed he had eaten something bad and it would pass but sx have persisted. Originally, he had abd cramping, and "heavy", dark BMs, "felt like I was emptying out my entire body." denies BMs were loose like diarrhea and only occur once per day. Now BMs have lightened in color and density. Abd pain/cramping persists. Denies any n/v. Denies fevers at home. Has been checking daily.  Took amoxicillin and probiotics since weekend. Has not taken any other sx related meds. Only Hx HIV, yearly f/u with ID. Last seen Feb 2018. Refused first colon screening at that time, and repeats the same to me "maybe when I am 55." But agreeable to see GI if needed once eval'd by NP tomorrow. Appt made. Pepto-bismal chewables given in case of recurrence of severe symptoms. Denies anything but cramping currently. Pt agreeable to plan. No further questions.

## 2017-04-08 ENCOUNTER — Encounter: Payer: Self-pay | Admitting: Registered Nurse

## 2017-04-08 ENCOUNTER — Ambulatory Visit: Payer: Self-pay | Admitting: Registered Nurse

## 2017-04-08 VITALS — BP 125/89 | HR 70 | Temp 97.3°F

## 2017-04-08 DIAGNOSIS — B20 Human immunodeficiency virus [HIV] disease: Secondary | ICD-10-CM

## 2017-04-08 DIAGNOSIS — R1084 Generalized abdominal pain: Secondary | ICD-10-CM

## 2017-04-08 NOTE — Patient Instructions (Addendum)
Bland diet until all symptoms resolve Routine colon cancer screening ER if bright red/black stools and worsening abdomen pain, diarrhea, vomiting  Gastritis, Adult Gastritis is inflammation of the stomach. There are two kinds of gastritis:  Acute gastritis. This kind develops suddenly.  Chronic gastritis. This kind lasts for a long time.  Gastritis happens when the lining of the stomach becomes weak or gets damaged. Without treatment, gastritis can lead to stomach bleeding and ulcers. What are the causes? This condition may be caused by:  An infection.  Drinking too much alcohol.  Certain medicines.  Having too much acid in the stomach.  A disease of the intestines or stomach.  Stress.  What are the signs or symptoms? Symptoms of this condition include:  Pain or a burning in the upper abdomen.  Nausea.  Vomiting.  An uncomfortable feeling of fullness after eating.  In some cases, there are no symptoms. How is this diagnosed? This condition may be diagnosed with:  A description of your symptoms.  A physical exam.  Tests. These can include: ? Blood tests. ? Stool tests. ? A test in which a thin, flexible instrument with a light and camera on the end is passed down the esophagus and into the stomach (upper endoscopy). ? A test in which a sample of tissue is taken for testing (biopsy).  How is this treated? This condition may be treated with medicines. If the condition is caused by a bacterial infection, you may be given antibiotic medicines. If it is caused by too much acid in the stomach, you may get medicines called H2 blockers, proton pump inhibitors, or antacids. Treatment may also involve stopping the use of certain medicines, such as aspirin, ibuprofen, or other nonsteroidal anti-inflammatory drugs (NSAIDs). Follow these instructions at home:  Take over-the-counter and prescription medicines only as told by your health care provider.  If you were  prescribed an antibiotic, take it as told by your health care provider. Do not stop taking the antibiotic even if you start to feel better.  Drink enough fluid to keep your urine clear or pale yellow.  Eat small, frequent meals instead of large meals. Contact a health care provider if:  Your symptoms get worse.  Your symptoms return after treatment. Get help right away if:  You vomit blood or material that looks like coffee grounds.  You have black or dark red stools.  You are unable to keep fluids down.  Your abdominal pain gets worse.  You have a fever.  You do not feel better after 1 week. This information is not intended to replace advice given to you by your health care provider. Make sure you discuss any questions you have with your health care provider. Document Released: 04/06/2001 Document Revised: 12/10/2015 Document Reviewed: 01/04/2015 Elsevier Interactive Patient Education  2018 [employer]. Colorectal Cancer Screening Colorectal cancer screening is a group of tests used to check for colorectal cancer. Colorectal refers to your colon and rectum. Your colon and rectum are located at the end of your large intestine and carry your bowel movements out of your body. Why is colorectal cancer screening done? It is common for abnormal growths (polyps) to form in the lining of your colon, especially as you get older. These polyps can be cancerous or become cancerous. If colorectal cancer is found at an early stage, it is treatable. Who should be screened for colorectal cancer? Screening is recommended for all adults at average risk starting at age 28. Tests may be  recommended every 1 to 10 years. Your health care provider may recommend earlier or more frequent screening if you have:  A history of colorectal cancer or polyps.  A family member with a history of colorectal cancer or polyps.  Inflammatory bowel disease, such as ulcerative colitis or Crohn disease.  A type of  hereditary colon cancer syndrome.  Colorectal cancer symptoms.  Types of screening tests There are several types of colorectal screening tests. They include:  Guaiac-based fecal occult blood testing.  Fecal immunochemical test (FIT).  Stool DNA test.  Barium enema.  Virtual colonoscopy.  Sigmoidoscopy. During this test, a sigmoidoscope is used to examine your rectum and lower colon. A sigmoidoscope is a flexible tube with a camera that is inserted through your anus into your rectum and lower colon.  Colonoscopy. During this test, a colonoscope is used to examine your entire colon. A colonoscope is a long, thin, flexible tube with a camera. This test examines your entire colon and rectum.  This information is not intended to replace advice given to you by your health care provider. Make sure you discuss any questions you have with your health care provider. Document Released: 09/30/2009 Document Revised: 11/20/2015 Document Reviewed: 07/19/2013 Elsevier Interactive Patient Education  2017 Elsevier Inc. Cholecystitis Cholecystitis is inflammation of the gallbladder. It is often called a gallbladder attack. The gallbladder is a pear-shaped organ that lies beneath the liver on the right side of the body. The gallbladder stores bile, which is a fluid that helps the body to digest fats. If bile builds up in your gallbladder, your gallbladder becomes inflamed. This condition may occur suddenly (be acute). Repeat episodes of acute cholecystitis or prolonged episodes may lead to a long-term (chronic) condition. Cholecystitis is serious and it requires treatment. What are the causes? The most common cause of this condition is gallstones. Gallstones can block the tube (duct) that carries bile out of your gallbladder. This causes bile to build up. Other causes of this condition include:  Damage to the gallbladder due to a decrease in blood flow.  Infections in the bile ducts.  Scars or kinks in  the bile ducts.  Tumors in the liver, pancreas, or gallbladder.  What increases the risk? This condition is more likely to develop in:  People who have sickle cell disease.  People who take birth control pills or use estrogen.  People who have alcoholic liver disease.  People who have liver cirrhosis.  People who have their nutrition delivered through a vein (parenteral nutrition).  People who do not eat or drink (do fasting) for a long period of time.  People who are obese.  People who have rapid weight loss.  People who are pregnant.  People who have increased triglyceride levels.  People who have pancreatitis.  What are the signs or symptoms? Symptoms of this condition include:  Abdominal pain, especially in the upper right area of the abdomen.  Abdominal tenderness or bloating.  Nausea.  Vomiting.  Fever.  Chills.  Yellowing of the skin and the whites of the eyes (jaundice).  How is this diagnosed? This condition is diagnosed with a medical history and physical exam. You may also have other tests, including:  Imaging tests, such as: ? An ultrasound of the gallbladder. ? A CT scan of the abdomen. ? A gallbladder nuclear scan (HIDA scan). This scan allows your health care provider to see the bile moving from your liver to your gallbladder and to your small intestine. ? MRI.  Blood tests, such as: ? A complete blood count, because the white blood cell count may be higher than normal. ? Liver function tests, because some levels may be higher than normal with certain types of gallstones.  How is this treated? Treatment may include:  Fasting for a certain amount of time.  IV fluids.  Medicine to treat pain or vomiting.  Antibiotic medicine.  Surgery to remove your gallbladder (cholecystectomy). This may happen immediately or at a later time.  Follow these instructions at home: Home care will depend on your treatment. In general:  Take  over-the-counter and prescription medicines only as told by your health care provider.  If you were prescribed an antibiotic medicine, take it as told by your health care provider. Do not stop taking the antibiotic even if you start to feel better.  Follow instructions from your health care provider about what to eat or drink. When you are allowed to eat, avoid eating or drinking anything that triggers your symptoms.  Keep all follow-up visits as told by your health care provider. This is important.  Contact a health care provider if:  Your pain is not controlled with medicine.  You have a fever. Get help right away if:  Your pain moves to another part of your abdomen or to your back.  You continue to have symptoms or you develop new symptoms even with treatment. This information is not intended to replace advice given to you by your health care provider. Make sure you discuss any questions you have with your health care provider. Document Released: 04/12/2005 Document Revised: 08/21/2015 Document Reviewed: 07/24/2014 Elsevier Interactive Patient Education  2017 [employer] Inc. Appendicitis The appendix is a finger-shaped tube that is attached to the large intestine. Appendicitis is inflammation of the appendix. Without treatment, appendicitis can cause the appendix to tear (rupture). A ruptured appendix can lead to a life-threatening infection. It can also lead to the formation of a painful collection of pus (abscess) in the appendix. What are the causes? This condition may be caused by a blockage in the appendix that leads to infection. The blockage can be due to:  A ball of stool.  Enlarged lymph glands.  In some cases, the cause may not be known. What increases the risk? This condition is more likely to develop in people who are 2810-51 years of age. What are the signs or symptoms? Symptoms of this condition include:  Pain around the belly button that moves toward the lower right  abdomen. The pain can become more severe as time passes. It gets worse with coughing or sudden movements.  Tenderness in the lower right abdomen.  Nausea.  Vomiting.  Loss of appetite.  Fever.  Constipation.  Diarrhea.  Generally not feeling well.  How is this diagnosed? This condition may be diagnosed with:  A physical exam.  Blood tests.  Urine test.  To confirm the diagnosis, an ultrasound, MRI, or CT scan may be done. How is this treated? This condition is usually treated by taking out the appendix (appendectomy). There are two methods for doing an appendectomy:  Open appendectomy. In this surgery, the appendix is removed through a large cut (incision) that is made in the lower right abdomen. This procedure may be recommended if: ? You have major scarring from a previous surgery. ? You have a bleeding disorder. ? You are pregnant and are near term. ? You have a condition that makes the laparoscopic procedure impossible, such as an advanced infection or a  ruptured appendix.  Laparoscopic appendectomy. In this surgery, the appendix is removed through small incisions. This procedure usually causes less pain and fewer problems than an open appendectomy. It also has a shorter recovery time.  If the appendix has ruptured and an abscess has formed, a drain may be placed into the abscess to remove fluid and antibiotic medicines may be given through an IV tube. The appendix may or may not need to be removed. This information is not intended to replace advice given to you by your health care provider. Make sure you discuss any questions you have with your health care provider. Document Released: 04/12/2005 Document Revised: 08/20/2015 Document Reviewed: 08/28/2014 Elsevier Interactive Patient Education  2017 Else[employer] Inc. Diverticulitis Diverticulitis is inflammation or infection of small pouches in your colon that form when you have a condition called diverticulosis. The  pouches in your colon are called diverticula. Your colon, or large intestine, is where water is absorbed and stool is formed. Complications of diverticulitis can include:  Bleeding.  Severe infection.  Severe pain.  Perforation of your colon.  Obstruction of your colon.  What are the causes? Diverticulitis is caused by bacteria. Diverticulitis happens when stool becomes trapped in diverticula. This allows bacteria to grow in the diverticula, which can lead to inflammation and infection. What increases the risk? People with diverticulosis are at risk for diverticulitis. Eating a diet that does not include enough fiber from fruits and vegetables may make diverticulitis more likely to develop. What are the signs or symptoms? Symptoms of diverticulitis may include:  Abdominal pain and tenderness. The pain is normally located on the left side of the abdomen, but may occur in other areas.  Fever and chills.  Bloating.  Cramping.  Nausea.  Vomiting.  Constipation.  Diarrhea.  Blood in your stool.  How is this diagnosed? Your health care provider will ask you about your medical history and do a physical exam. You may need to have tests done because many medical conditions can cause the same symptoms as diverticulitis. Tests may include:  Blood tests.  Urine tests.  Imaging tests of the abdomen, including X-rays and CT scans.  When your condition is under control, your health care provider may recommend that you have a colonoscopy. A colonoscopy can show how severe your diverticula are and whether something else is causing your symptoms. How is this treated? Most cases of diverticulitis are mild and can be treated at home. Treatment may include:  Taking over-the-counter pain medicines.  Following a clear liquid diet.  Taking antibiotic medicines by mouth for 7-10 days.  More severe cases may be treated at a hospital. Treatment may include:  Not eating or  drinking.  Taking prescription pain medicine.  Receiving antibiotic medicines through an IV tube.  Receiving fluids and nutrition through an IV tube.  Surgery.  Follow these instructions at home:  Follow your health care provider's instructions carefully.  Follow a full liquid diet or other diet as directed by your health care provider. After your symptoms improve, your health care provider may tell you to change your diet. He or she may recommend you eat a high-fiber diet. Fruits and vegetables are good sources of fiber. Fiber makes it easier to pass stool.  Take fiber supplements or probiotics as directed by your health care provider.  Only take medicines as directed by your health care provider.  Keep all your follow-up appointments. Contact a health care provider if:  Your pain does not improve.  You  have a hard time eating food.  Your bowel movements do not return to normal. Get help right away if:  Your pain becomes worse.  Your symptoms do not get better.  Your symptoms suddenly get worse.  You have a fever.  You have repeated vomiting.  You have bloody or black, tarry stools. This information is not intended to replace advice given to you by your health care provider. Make sure you discuss any questions you have with your health care provider. Document Released: 01/20/2005 Document Revised: 09/18/2015 Document Reviewed: 03/07/2013 Elsevier Interactive Patient Education  2017 [employer]

## 2017-04-08 NOTE — Progress Notes (Signed)
Subjective:    Patient ID: Barry Zhang, male    DOB: 08/28/1965, 51 y.o.   MRN: [id number]  51y/o caucasian male established Pt here for evaluation of belly pain 2-3/10 today  am feeling much better today almost cancelled my appt.  He seen by RN Nance PewHaley Workman yesterday: "Pt reports sudden onset of periumbilical abd pain 1.5 weeks ago. At time of onset, he assumed he had eaten something bad and it would pass but sx have persisted. Originally, he had abd cramping, and "heavy", dark BMs, "felt like I was emptying out my entire body." denies BMs were loose like diarrhea and only occurd once per day. Now BMs have lightened in color to a tan and density increased. Abd pain/cramping persists but not as intense.  Stated stools were not explosive or watery, he did not consider them diarrhea just large in quantity.  Denied changes in caliber of stool prior to abdomen discomfort and back to normal quantity/size today.   Denies any n/v, fevers, bright red blood in stool or urine, vomiting.  Has been checking his temperature twice a day  I was eating high fat foods, a lot of butter around Thanksgiving and real cream in my coffee instead of artificial powder the past two weeks.  I think that caused my problems  Took amoxicillin and probiotics x 6 days and day by day feeling better.  Has not taken any other sx related meds. PMHx HIV on atripla po yearly f/u with Infectious Disease. Last seen Feb 2018. Refused first colon screening at that time, and repeats the same to me "maybe when I am 55." But agreeable to see GI if needed once evaluated by NP 14 Dec.  Appt made. Pepto-bismal chewables given in case of recurrence of severe symptoms by RN to patient. He denied anything but cramping in past 24 hours.          Review of Systems  Constitutional: Negative for activity change, appetite change, chills, diaphoresis, fatigue, fever and unexpected weight change.  HENT: Negative for congestion, postnasal drip,  rhinorrhea and sore throat.   Eyes: Negative for photophobia and visual disturbance.  Respiratory: Negative for cough.   Cardiovascular: Negative for chest pain and palpitations.  Gastrointestinal: Positive for abdominal pain and nausea. Negative for abdominal distention, constipation and vomiting.  Endocrine: Negative for cold intolerance and heat intolerance.  Genitourinary: Negative for decreased urine volume, difficulty urinating, dysuria, enuresis, flank pain, frequency, genital sores, hematuria, testicular pain and urgency.  Musculoskeletal: Negative for arthralgias, back pain, gait problem, joint swelling, neck pain and neck stiffness.  Skin: Negative for color change, pallor, rash and wound.  Allergic/Immunologic: Positive for immunocompromised state. Negative for food allergies.  Neurological: Negative for dizziness, tremors, seizures, syncope, facial asymmetry, speech difficulty, weakness, light-headedness, numbness and headaches.  Hematological: Negative for adenopathy. Does not bruise/bleed easily.  Psychiatric/Behavioral: Negative for agitation, confusion and sleep disturbance.       Objective:   Physical Exam  Constitutional: He is oriented to person, place, and time. Vital signs are normal. He appears well-developed and well-nourished.  Non-toxic appearance. He does not have a sickly appearance. He does not appear ill. No distress.  HENT:  Head: Normocephalic and atraumatic.  Right Ear: Hearing and external ear normal. A middle ear effusion is present.  Left Ear: Hearing and external ear normal. A middle ear effusion is present.  Nose: Nose normal.  Mouth/Throat: Uvula is midline, oropharynx is clear and moist and mucous membranes are normal. Mucous  membranes are not pale, not dry and not cyanotic. He does not have dentures. No oral lesions. No trismus in the jaw. Normal dentition. No dental abscesses, uvula swelling, lacerations or dental caries. No oropharyngeal exudate,  posterior oropharyngeal edema, posterior oropharyngeal erythema or tonsillar abscesses.  Bilateral TMs air fluid level clear; bilateral allergic shiners  Eyes: Conjunctivae, EOM and lids are normal. Pupils are equal, round, and reactive to light. Right eye exhibits no discharge. Left eye exhibits no discharge. No scleral icterus.  Neck: Trachea normal, normal range of motion and phonation normal. Neck supple. No muscular tenderness present. No neck rigidity. No tracheal deviation, no edema, no erythema and normal range of motion present.  Cardiovascular: Normal rate, regular rhythm, normal heart sounds and intact distal pulses.  Pulses:      Radial pulses are 2+ on the right side, and 2+ on the left side.  Pulmonary/Chest: Effort normal and breath sounds normal. No accessory muscle usage or stridor. No respiratory distress. He has no decreased breath sounds. He has no wheezes. He has no rhonchi. He has no rales. He exhibits no tenderness.  No cough observed exam room; spoke full sentences without difficulty  Abdominal: Soft. Normal appearance and bowel sounds are normal. He exhibits no shifting dullness, no distension, no pulsatile liver, no fluid wave, no abdominal bruit, no ascites, no pulsatile midline mass and no mass. There is no tenderness. There is no rigidity, no rebound, no guarding, no CVA tenderness, no tenderness at McBurney's point and negative Murphy's sign. Hernia confirmed negative in the ventral area.    Dull to percussion x 4 quads; normoactive bowel sounds x 4 quads  Musculoskeletal: Normal range of motion. He exhibits no edema, tenderness or deformity.       Right shoulder: Normal.       Left shoulder: Normal.       Right elbow: Normal.      Left elbow: Normal.       Right hip: Normal.       Left hip: Normal.       Right knee: Normal.       Left knee: Normal.       Cervical back: Normal.       Thoracic back: Normal.       Lumbar back: Normal.       Right hand: Normal.        Left hand: Normal.  Lymphadenopathy:       Head (right side): No submental, no submandibular, no tonsillar, no preauricular, no posterior auricular and no occipital adenopathy present.       Head (left side): No submental, no submandibular, no tonsillar, no preauricular, no posterior auricular and no occipital adenopathy present.    He has no cervical adenopathy.       Right cervical: No superficial cervical, no deep cervical and no posterior cervical adenopathy present.      Left cervical: No superficial cervical, no deep cervical and no posterior cervical adenopathy present.  Neurological: He is alert and oriented to person, place, and time. He has normal strength. He is not disoriented. He displays no atrophy and no tremor. No cranial nerve deficit or sensory deficit. He exhibits normal muscle tone. He displays no seizure activity. Coordination and gait normal. GCS eye subscore is 4. GCS verbal subscore is 5. GCS motor subscore is 6.  Lie down/sit up/On/off exam table without difficulty; in/out of chair without difficulty; gait sure and steady in hallway  Skin: Skin is warm, dry  and intact. No abrasion, no bruising, no burn, no ecchymosis, no laceration, no lesion, no petechiae and no rash noted. He is not diaphoretic. No cyanosis or erythema. No pallor. Nails show no clubbing.  Psychiatric: He has a normal mood and affect. His speech is normal and behavior is normal. Judgment and thought content normal. Cognition and memory are normal.          Assessment & Plan:  A-abdomen pain, HIV  P-Stop amoxicillin.  May continue probiotics. Per [facility] GI symptoms common adverse effect with atripla.  Consider labs if symptoms do not resolve this weekend--LFTs stable 10/18/2016 from 2017.  Discussed signs and symptoms of cholecystitis, appendicitis, diverticulitis with patient and given exitcare handouts on each.  Gastritis handout given/printed recommended bland diet avoid high fat, large meat  portions, spicy, fried until all symptoms resolve/hydrate.  Due for routine colon cancer screening will self refer. Mother decreased ovarian cancer and patient with history of HIV at increased risk colon cancer.  Saw infectious disease provider Feb 2018 and asked was told  Start colonoscopy at age 51.  Discussed with patient due to new symptoms if persists recommend colon cancer screening sooner within the next month. If vomiting hold po intake x 1 hour.  Then sips clear fluids like broths, ginger ale, power ade, gatorade, pedialyte may advance to soft/bland if no vomiting x 24 hours and appetite returned otherwise hydration main focus. Avoid aspirin/NSAIDS as typically harder on stomach can worsen gastroenteritis recommend tylenol 1000mg  po QID prn pain.  Return to the clinic if symptoms persist or worsen; I have alerted the patient [place] to ER if high fever, dehydration, marked weakness, fainting, increased abdominal pain, blood in stool or vomit (red or black).  Peptobismol could cause black stools. Patient verbalized agreement and understanding of treatment plan and had no further questions at this time.   Hiv follow up annual due Feb 2019 with infectious disease.  Continue atripla as prescribed  Patient verbalized understanding information, instructions, agreed with plan of care and had no further questions at this time.

## 2017-04-25 ENCOUNTER — Telehealth: Payer: Self-pay | Admitting: Registered Nurse

## 2017-04-25 ENCOUNTER — Encounter: Payer: Self-pay | Admitting: Registered Nurse

## 2017-04-25 MED ORDER — LACTASE 3000 UNITS PO TABS: 3000.0000 [IU] | ORAL_TABLET | Freq: Three times a day (TID) | ORAL | 0 refills | Status: AC

## 2017-04-25 NOTE — Telephone Encounter (Signed)
Contacted patient to follow up on bowel changes and abdomen pain last clinic visit 04/08/2017.  Patient reported his bowel habits were 100% back to normal until Christmas Eve and he reported unsettling of stomach after Christmas Eve 24 Dec 18 meal resolved in a day or two.  He ate heavy milk, cream foods, cheese at holiday meals.  He is wondering if he is lactose intolerant.  Discussed with patient it could be symptoms of lactose intolerance or still one of differential diagnoses discussed at previous appt/handouts given at that time.  He can try taking OTC lactaid/lactase/lactrase or dairy ease tablet/chewable at start of each meal with dairy and see if symptoms do not occur or are milder than he experienced the past two months.  Lactose intolerance can occur with aging or other health conditions.  Recommended bland diet until symptoms resolve e.g. No spicy/fried/diary/large portions meat.  Hydrate.  If not improving with plan of care [facility] 2 [facility]  ER if hematochezia, bad abdomen pain, repetitive vomiting.  Up to date basic handout on lactose intolerance sent to patient work email [email or web address]  Patient asked if testing could be done in our clinic and notified him breath test/scope/labs typically done at GI specialist office.  Patient verbalized understanding information/instructions, agreed with plan of care and had no further questions at this time.

## 2017-04-29 ENCOUNTER — Telehealth: Payer: Self-pay | Admitting: Registered Nurse

## 2017-04-29 NOTE — Telephone Encounter (Signed)
Late entry Left message 04/28/2017  patient to contact me regarding if GI symptoms improved or worsening with plan of care to start OTC lactase enzyme supplement.  Patient reported he started OTC lactose supplement and taking per manufacturer instructions prn and limiting dairy intake and symptoms have improved.  Will [place] of [place] next [place].  Planning to schedule appt with GI specialist for possible lactose intolerance/deficiency and routine colon cancer screening when he returns from work trip.  Patient returned call and had no further questions/concerns at this time.

## 2017-05-31 ENCOUNTER — Telehealth: Payer: Self-pay | Admitting: Registered Nurse

## 2017-05-31 ENCOUNTER — Encounter: Payer: Self-pay | Admitting: Registered Nurse

## 2017-05-31 NOTE — Telephone Encounter (Signed)
Patient reported GI symptoms have resolved.  He has appt scheduled for annual with PCM coming up later this month and is going to discuss routine colon cancer/colonoscopy screening with PCM.

## 2017-06-02 ENCOUNTER — Other Ambulatory Visit (HOSPITAL_COMMUNITY)
Admission: RE | Admit: 2017-06-02 | Discharge: 2017-06-02 | Disposition: A | Discharge status: Home / Self Care | Payer: PRIVATE HEALTH INSURANCE | Source: Ambulatory Visit | Attending: Infectious Diseases | Admitting: Infectious Diseases

## 2017-06-02 ENCOUNTER — Other Ambulatory Visit: Payer: PRIVATE HEALTH INSURANCE

## 2017-06-02 ENCOUNTER — Other Ambulatory Visit: Payer: Self-pay

## 2017-06-02 DIAGNOSIS — Z113 Encounter for screening for infections with a predominantly sexual mode of transmission: Secondary | ICD-10-CM

## 2017-06-02 DIAGNOSIS — B20 Human immunodeficiency virus [HIV] disease: Secondary | ICD-10-CM

## 2017-06-02 DIAGNOSIS — Z79899 Other long term (current) drug therapy: Secondary | ICD-10-CM

## 2017-06-03 LAB — COMPLETE METABOLIC PANEL WITH GFR
AG Ratio: 1.8 (calc) (ref 1.0–2.5)
ALT: 28 U/L (ref 9–46)
AST: 19 U/L (ref 10–35)
Albumin: 4.2 g/dL (ref 3.6–5.1)
Alkaline phosphatase (APISO): 94 U/L (ref 40–115)
BUN: 14 mg/dL (ref 7–25)
CALCIUM: 9.3 mg/dL (ref 8.6–10.3)
CHLORIDE: 107 mmol/L (ref 98–110)
CO2: 27 mmol/L (ref 20–32)
Creat: 1.25 mg/dL (ref 0.70–1.33)
GFR, EST AFRICAN AMERICAN: 77 mL/min/{1.73_m2} (ref >=60)
GFR, EST NON AFRICAN AMERICAN: 66 mL/min/{1.73_m2} (ref >=60)
GLUCOSE: 103 mg/dL — AB (ref 65–99)
Globulin: 2.4 g/dL (calc) (ref 1.9–3.7)
Potassium: 4.5 mmol/L (ref 3.5–5.3)
Sodium: 142 mmol/L (ref 135–146)
TOTAL PROTEIN: 6.6 g/dL (ref 6.1–8.1)
Total Bilirubin: 0.3 mg/dL (ref 0.2–1.2)

## 2017-06-03 LAB — CBC WITH DIFFERENTIAL/PLATELET
BASOS PCT: 0.5 %
Basophils Absolute: 19 cells/uL (ref 0–200)
Eosinophils Absolute: 70 cells/uL (ref 15–500)
Eosinophils Relative: 1.9 %
HCT: 46.5 % (ref 38.5–50.0)
HEMOGLOBIN: 16 g/dL (ref 13.2–17.1)
Lymphs Abs: 1066 cells/uL (ref 850–3900)
MCH: 31.3 pg (ref 27.0–33.0)
MCHC: 34.4 g/dL (ref 32.0–36.0)
MCV: 91 fL (ref 80.0–100.0)
MPV: 11.9 fL (ref 7.5–12.5)
Monocytes Relative: 9.2 %
NEUTROS ABS: 2205 {cells}/uL (ref 1500–7800)
Neutrophils Relative %: 59.6 %
Platelets: 155 10*3/uL (ref 140–400)
RBC: 5.11 10*6/uL (ref 4.20–5.80)
RDW: 12.4 % (ref 11.0–15.0)
Total Lymphocyte: 28.8 %
WBC: 3.7 10*3/uL — AB (ref 3.8–10.8)
WBCMIX: 340 {cells}/uL (ref 200–950)

## 2017-06-03 LAB — LIPID PANEL
CHOL/HDL RATIO: 4.1 (calc) (ref <5.0)
CHOLESTEROL: 188 mg/dL (ref <200)
HDL: 46 mg/dL (ref >40)
LDL CHOLESTEROL (CALC): 120 mg/dL — AB
Non-HDL Cholesterol (Calc): 142 mg/dL (calc) — ABNORMAL HIGH (ref <130)
TRIGLYCERIDES: 116 mg/dL (ref <150)

## 2017-06-03 LAB — RPR TITER: RPR Titer: 1:2 {titer} — ABNORMAL HIGH

## 2017-06-03 LAB — URINE CYTOLOGY ANCILLARY ONLY
CHLAMYDIA, DNA PROBE: NEGATIVE
Neisseria Gonorrhea: NEGATIVE

## 2017-06-03 LAB — T-HELPER CELL (CD4) - (RCID CLINIC ONLY)
CD4 T CELL ABS: 400 /uL (ref 400–2700)
CD4 T CELL HELPER: 35 % (ref 33–55)

## 2017-06-03 LAB — RPR: RPR Ser Ql: REACTIVE — AB

## 2017-06-03 LAB — FLUORESCENT TREPONEMAL AB(FTA)-IGG-BLD: Fluorescent Treponemal ABS: REACTIVE — AB

## 2017-06-04 LAB — HIV-1 RNA ULTRAQUANT REFLEX TO GENTYP+: HIV 1 RNA Quant: <20 Copies/mL

## 2017-06-14 ENCOUNTER — Ambulatory Visit: Payer: Self-pay | Admitting: Registered Nurse

## 2017-06-14 VITALS — BP 123/89 | HR 63 | Temp 97.3°F

## 2017-06-14 DIAGNOSIS — J301 Allergic rhinitis due to pollen: Secondary | ICD-10-CM

## 2017-06-14 DIAGNOSIS — J0101 Acute recurrent maxillary sinusitis: Secondary | ICD-10-CM

## 2017-06-14 MED ORDER — DOXYCYCLINE HYCLATE 100 MG PO TABS: 100.0000 mg | ORAL_TABLET | Freq: Two times a day (BID) | ORAL | 0 refills | Status: DC

## 2017-06-14 MED ORDER — FLUTICASONE PROPIONATE 50 MCG/ACT NA SUSP: 1.0000 | Freq: Two times a day (BID) | NASAL | 6 refills | Status: DC | PRN

## 2017-06-14 NOTE — Progress Notes (Signed)
Subjective:    Patient ID: Barry Zhang, male    DOB: 22-Feb-1966, 52 y.o.   MRN: [id number]  51y/o caucasian male established Pt c/o sinus pain and pressure (R>L), nasal congestion, rhinorrhea x1 week. For past 2 days with thick green mucus and upper teeth pain. Using Mucinex sinus relief at home. Denies fever/chills,current sore throat, chest congestion, cough.  + sick contacts at work  Last sinus infection augmentin worked well.  Has been using saline but not flonase.      Review of Systems  Constitutional: Negative for activity change, appetite change, chills, diaphoresis, fatigue, fever and unexpected weight change.  HENT: Positive for congestion, postnasal drip, rhinorrhea, sinus pressure and sinus pain. Negative for dental problem, drooling, ear discharge, ear pain, facial swelling, hearing loss, mouth sores, nosebleeds, sneezing, sore throat, tinnitus, trouble swallowing and voice change.   Eyes: Negative for photophobia, pain, discharge, redness, itching and visual disturbance.  Respiratory: Positive for cough. Negative for choking, chest tightness, shortness of breath, wheezing and stridor.   Cardiovascular: Negative for chest pain, palpitations and leg swelling.  Gastrointestinal: Negative for abdominal distention, abdominal pain, blood in stool, diarrhea, nausea and vomiting.  Endocrine: Negative for cold intolerance and heat intolerance.  Genitourinary: Negative for dysuria.  Musculoskeletal: Negative for arthralgias, back pain, gait problem, joint swelling, myalgias, neck pain and neck stiffness.  Skin: Negative for color change, pallor, rash and wound.  Allergic/Immunologic: Positive for environmental allergies and immunocompromised state. Negative for food allergies.  Neurological: Negative for dizziness, tremors, seizures, syncope, facial asymmetry, speech difficulty, weakness, light-headedness, numbness and headaches.  Hematological: Negative for adenopathy. Does not  bruise/bleed easily.  Psychiatric/Behavioral: Negative for agitation, confusion and sleep disturbance.       Objective:   Physical Exam  Constitutional: He is oriented to person, place, and time. Vital signs are normal. He appears well-developed and well-nourished. He is active and cooperative.  Non-toxic appearance. He does not have a sickly appearance. He appears ill. No distress.  HENT:  Head: Normocephalic and atraumatic.  Right Ear: Hearing, external ear and ear canal normal. A middle ear effusion is present.  Left Ear: Hearing, external ear and ear canal normal. A middle ear effusion is present.  Nose: Mucosal edema and rhinorrhea present. No nose lacerations, sinus tenderness, nasal deformity, septal deviation or nasal septal hematoma. No epistaxis.  No foreign bodies. Right sinus exhibits maxillary sinus tenderness. Right sinus exhibits no frontal sinus tenderness. Left sinus exhibits no maxillary sinus tenderness and no frontal sinus tenderness.  Mouth/Throat: Uvula is midline and mucous membranes are normal. Mucous membranes are not pale, not dry and not cyanotic. He does not have dentures. No oral lesions. No trismus in the jaw. Normal dentition. No dental abscesses, uvula swelling, lacerations or dental caries. Posterior oropharyngeal edema and posterior oropharyngeal erythema present. No oropharyngeal exudate or tonsillar abscesses.  Cobblestoning posterior pharynx; bilateral TMs air fluid level clear; bilateral nasal turbinates clear discharge/erythema; bilateral allergic shiners; medial right maxillary sinus most tender to palpations  Eyes: Conjunctivae, EOM and lids are normal. Pupils are equal, round, and reactive to light. Right eye exhibits no chemosis, no discharge, no exudate and no hordeolum. No foreign body present in the right eye. Left eye exhibits no chemosis, no discharge, no exudate and no hordeolum. No foreign body present in the left eye. Right conjunctiva is not  injected. Right conjunctiva has no hemorrhage. Left conjunctiva is not injected. Left conjunctiva has no hemorrhage. No scleral icterus. Right eye exhibits normal  extraocular motion and no nystagmus. Left eye exhibits normal extraocular motion and no nystagmus. Right pupil is round and reactive. Left pupil is round and reactive. Pupils are equal.  Neck: Trachea normal, normal range of motion and phonation normal. Neck supple. No tracheal tenderness and no muscular tenderness present. No neck rigidity. No tracheal deviation, no edema, no erythema and normal range of motion present. No thyroid mass and no thyromegaly present.  Cardiovascular: Normal rate, regular rhythm, S1 normal, S2 normal, normal heart sounds and intact distal pulses. PMI is not displaced. Exam reveals no gallop and no friction rub.  No murmur heard. Pulmonary/Chest: Effort normal and breath sounds normal. No accessory muscle usage or stridor. No respiratory distress. He has no decreased breath sounds. He has no wheezes. He has no rhonchi. He has no rales.  Abdominal: Soft. Normal appearance. He exhibits no distension, no fluid wave and no ascites. There is no rigidity and no guarding.  Musculoskeletal: Normal range of motion. He exhibits no edema or tenderness.       Right shoulder: Normal.       Left shoulder: Normal.       Right elbow: Normal.      Left elbow: Normal.       Right hip: Normal.       Left hip: Normal.       Right knee: Normal.       Left knee: Normal.       Cervical back: Normal.       Thoracic back: Normal.       Lumbar back: Normal.       Right hand: Normal.       Left hand: Normal.  Lymphadenopathy:       Head (right side): No submental, no submandibular, no tonsillar, no preauricular, no posterior auricular and no occipital adenopathy present.       Head (left side): No submental, no submandibular, no tonsillar, no preauricular, no posterior auricular and no occipital adenopathy present.    He has no  cervical adenopathy.       Right cervical: No superficial cervical, no deep cervical and no posterior cervical adenopathy present.      Left cervical: No superficial cervical, no deep cervical and no posterior cervical adenopathy present.  Neurological: He is alert and oriented to person, place, and time. He has normal strength. He displays no atrophy and no tremor. No cranial nerve deficit or sensory deficit. He exhibits normal muscle tone. He displays no seizure activity. Coordination and gait normal. GCS eye subscore is 4. GCS verbal subscore is 5. GCS motor subscore is 6.  On/off exam table; in/out of chair without difficulty; gait sure and steady in hallway/exam room  Skin: Skin is warm, dry and intact. No abrasion, no bruising, no burn, no ecchymosis, no laceration, no lesion, no petechiae and no rash noted. He is not diaphoretic. No cyanosis or erythema. No pallor. Nails show no clubbing.  Psychiatric: He has a normal mood and affect. His speech is normal and behavior is normal. Judgment and thought content normal. Cognition and memory are normal.  Nursing note and vitals reviewed.         Assessment & Plan:  A- recurrent acute maxillary sinusitis  P-Restart flonase 1 spray each nostril BID #1 RF6 electronic Rx, continue saline 2 sprays each nostril q2h wa prn congestion 1 bottle given from clinic stock.  If no improvement with 48 hours of saline and flonase use start doxycycline 100mg  po BID  x 10 days #20 RF0 dispensed from [facility] discussed wear sunscreen/protective clothing as increased risk for sunburn with doxycycline use.  Patient immunocompromised on atripla. Phenylephrine 10mg  po q6h prn rhinitis 4 UD given to patient from clinic stock--discussed check OTC medications to see if this ingredient in any cough and cold/sinus medications he purchases. Discussed may cause drowsiness, increased BP/heart rate.  Hydrate to keep urine clear pale yellow.  Denied personal or family history of ENT  cancer.  Shower BID especially prior to bed. No evidence of systemic bacterial infection, non toxic and well hydrated.  I do not see where any further testing or imaging is necessary at this time.   I will suggest supportive care, rest, good hygiene and encourage the patient to take adequate fluids.  The patient is to return to clinic or EMERGENCY ROOM if symptoms worsen or change significantly.  Exitcare handout on sinusitis and sinus rinse given to patient.  Patient verbalized agreement and understanding of treatment plan and had no further questions at this time.   P2:  Hand washing and cover cough  Spring allergy season has started tree pollen high currently grass and flowers will be next.  Patient may use normal saline nasal spray 2 sprays each nostril q2h wa as needed. flonase 1 spray each nostril BID #1 RF6.  Patient denied personal or family history of ENT cancer.  OTC antihistamine of choice allegra 180mg  po daily restart.  Avoid triggers if possible.  Shower prior to bedtime if exposed to triggers.  If allergic dust/dust mites recommend mattress/pillow covers/encasements; washing linens, vacuuming, sweeping, dusting weekly.  Call or return to clinic as needed if these symptoms worsen or fail to improve as anticipated.   Exitcare handout on allergic rhinitis and sinus rinse given to patient.  Patient verbalized understanding of instructions, agreed with plan of care and had no further questions at this time.  P2:  Avoidance and hand washing.

## 2017-06-14 NOTE — Patient Instructions (Signed)
Sinusitis, Adult Sinusitis is soreness and inflammation of your sinuses. Sinuses are hollow spaces in the bones around your face. Your sinuses are located:  Around your eyes.  In the middle of your forehead.  Behind your nose.  In your cheekbones.  Your sinuses and nasal passages are lined with a stringy fluid (mucus). Mucus normally drains out of your sinuses. When your nasal tissues become inflamed or swollen, the mucus can become trapped or blocked so air cannot flow through your sinuses. This allows bacteria, viruses, and funguses to grow, which leads to infection. Sinusitis can develop quickly and last for 7?10 days (acute) or for more than 12 weeks (chronic). Sinusitis often develops after a cold. What are the causes? This condition is caused by anything that creates swelling in the sinuses or stops mucus from draining, including:  Allergies.  Asthma.  Bacterial or viral infection.  Abnormally shaped bones between the nasal passages.  Nasal growths that contain mucus (nasal polyps).  Narrow sinus openings.  Pollutants, such as chemicals or irritants in the air.  A foreign object stuck in the nose.  A fungal infection. This is rare.  What increases the risk? The following factors may make you more likely to develop this condition:  Having allergies or asthma.  Having had a recent cold or respiratory tract infection.  Having structural deformities or blockages in your nose or sinuses.  Having a weak immune system.  Doing a lot of swimming or diving.  Overusing nasal sprays.  Smoking.  What are the signs or symptoms? The main symptoms of this condition are pain and a feeling of pressure around the affected sinuses. Other symptoms include:  Upper toothache.  Earache.  Headache.  Bad breath.  Decreased sense of smell and taste.  A cough that may get worse at night.  Fatigue.  Fever.  Thick drainage from your nose. The drainage is often green and  it may contain pus (purulent).  Stuffy nose or congestion.  Postnasal drip. This is when extra mucus collects in the throat or back of the nose.  Swelling and warmth over the affected sinuses.  Sore throat.  Sensitivity to light.  How is this diagnosed? This condition is diagnosed based on symptoms, a medical history, and a physical exam. To find out if your condition is acute or chronic, your health care provider may:  Look in your nose for signs of nasal polyps.  Tap over the affected sinus to check for signs of infection.  View the inside of your sinuses using an imaging device that has a light attached (endoscope).  If your health care provider suspects that you have chronic sinusitis, you may also:  Be tested for allergies.  Have a sample of mucus taken from your nose (nasal culture) and checked for bacteria.  Have a mucus sample examined to see if your sinusitis is related to an allergy.  If your sinusitis does not respond to treatment and it lasts longer than 8 weeks, you may have an MRI or CT scan to check your sinuses. These scans also help to determine how severe your infection is. In rare cases, a bone biopsy may be done to rule out more serious types of fungal sinus disease. How is this treated? Treatment for sinusitis depends on the cause and whether your condition is chronic or acute. If a virus is causing your sinusitis, your symptoms will go away on their own within 10 days. You may be given medicines to relieve your symptoms,   including:  Topical nasal decongestants. They shrink swollen nasal passages and let mucus drain from your sinuses.  Antihistamines. These drugs block inflammation that is triggered by allergies. This can help to ease swelling in your nose and sinuses.  Topical nasal corticosteroids. These are nasal sprays that ease inflammation and swelling in your nose and sinuses.  Nasal saline washes. These rinses can help to get rid of thick mucus in  your nose.  If your condition is caused by bacteria, you will be given an antibiotic medicine. If your condition is caused by a fungus, you will be given an antifungal medicine. Surgery may be needed to correct underlying conditions, such as narrow nasal passages. Surgery may also be needed to remove polyps. Follow these instructions at home: Medicines  Take, use, or apply over-the-counter and prescription medicines only as told by your health care provider. These may include nasal sprays.  If you were prescribed an antibiotic medicine, take it as told by your health care provider. Do not stop taking the antibiotic even if you start to feel better. Hydrate and Humidify  Drink enough water to keep your urine clear or pale yellow. Staying hydrated will help to thin your mucus.  Use a cool mist humidifier to keep the humidity level in your home above 50%.  Inhale steam for 10-15 minutes, 3-4 times a day or as told by your health care provider. You can do this in the bathroom while a hot shower is running.  Limit your exposure to cool or dry air. Rest  Rest as much as possible.  Sleep with your head raised (elevated).  Make sure to get enough sleep each night. General instructions  Apply a warm, moist washcloth to your face 3-4 times a day or as told by your health care provider. This will help with discomfort.  Wash your hands often with soap and water to reduce your exposure to viruses and other germs. If soap and water are not available, use hand sanitizer.  Do not smoke. Avoid being around people who are smoking (secondhand smoke).  Keep all follow-up visits as told by your health care provider. This is important. Contact a health care provider if:  You have a fever.  Your symptoms get worse.  Your symptoms do not improve within 10 days. Get help right away if:  You have a severe headache.  You have persistent vomiting.  You have pain or swelling around your face or  eyes.  You have vision problems.  You develop confusion.  Your neck is stiff.  You have trouble breathing. This information is not intended to replace advice given to you by your health care provider. Make sure you discuss any questions you have with your health care provider. Document Released: 04/12/2005 Document Revised: 12/07/2015 Document Reviewed: 02/05/2015 Elsevier Interactive Patient Education  2018 [employer]. Allergic Rhinitis, Adult Allergic rhinitis is an allergic reaction that affects the mucous membrane inside the nose. It causes sneezing, a runny or stuffy nose, and the feeling of mucus going down the back of the throat (postnasal drip). Allergic rhinitis can be mild to severe. There are two types of allergic rhinitis:  Seasonal. This type is also called hay fever. It happens only during certain seasons.  Perennial. This type can happen at any time of the year.  What are the causes? This condition happens when the body's defense system (immune system) responds to certain harmless substances called allergens as though they were germs.  Seasonal allergic rhinitis is  triggered by pollen, which can come from grasses, trees, and weeds. Perennial allergic rhinitis may be caused by:  House dust mites.  Pet dander.  Mold spores.  What are the signs or symptoms? Symptoms of this condition include:  Sneezing.  Runny or stuffy nose (nasal congestion).  Postnasal drip.  Itchy nose.  Tearing of the eyes.  Trouble sleeping.  Daytime sleepiness.  How is this diagnosed? This condition may be diagnosed based on:  Your medical history.  A physical exam.  Tests to check for related conditions, such as: ? Asthma. ? Pink eye. ? Ear infection. ? Upper respiratory infection.  Tests to find out which allergens trigger your symptoms. These may include skin or blood tests.  How is this treated? There is no cure for this condition, but treatment can help control  symptoms. Treatment may include:  Taking medicines that block allergy symptoms, such as antihistamines. Medicine may be given as a shot, nasal spray, or pill.  Avoiding the allergen.  Desensitization. This treatment involves getting ongoing shots until your body becomes less sensitive to the allergen. This treatment may be done if other treatments do not help.  If taking medicine and avoiding the allergen does not work, new, stronger medicines may be prescribed.  Follow these instructions at home:  Find out what you are allergic to. Common allergens include smoke, dust, and pollen.  Avoid the things you are allergic to. These are some things you can do to help avoid allergens: ? Replace carpet with wood, tile, or vinyl flooring. Carpet can trap dander and dust. ? Do not smoke. Do not allow smoking in your home. ? Change your heating and air conditioning filter at least once a month. ? During allergy season:  Keep windows closed as much as possible.  Plan outdoor activities when pollen counts are lowest. This is usually during the evening hours.  When coming indoors, change clothing and shower before sitting on furniture or bedding.  Take over-the-counter and prescription medicines only as told by your health care provider.  Keep all follow-up visits as told by your health care provider. This is important. Contact a health care provider if:  You have a fever.  You develop a persistent cough.  You make whistling sounds when you breathe (you wheeze).  Your symptoms interfere with your normal daily activities. Get help right away if:  You have shortness of breath. Summary  This condition can be managed by taking medicines as directed and avoiding allergens.  Contact your health care provider if you develop a persistent cough or fever.  During allergy season, keep windows closed as much as possible. This information is not intended to replace advice given to you by your  health care provider. Make sure you discuss any questions you have with your health care provider. Document Released: 01/05/2001 Document Revised: 05/20/2016 Document Reviewed: 05/20/2016 Elsevier Interactive Patient Education  2018 [employer]. Sinus Rinse What is a sinus rinse? A sinus rinse is a simple home treatment that is used to rinse your sinuses with a sterile mixture of salt and water (saline solution). Sinuses are air-filled spaces in your skull behind the bones of your face and forehead that open into your nasal cavity. You will use the following:  Saline solution.  Neti pot or spray bottle. This releases the saline solution into your nose and through your sinuses. Neti pots and spray bottles can be purchased at [employer], a health food store, or online.  When would  I do a sinus rinse? A sinus rinse can help to clear mucus, dirt, dust, or pollen from the nasal cavity. You may do a sinus rinse when you have a cold, a virus, nasal allergy symptoms, a sinus infection, or stuffiness in the nose or sinuses. If you are considering a sinus rinse:  Ask your child's health care provider before performing a sinus rinse on your child.  Do not do a sinus rinse if you have had ear or nasal surgery, ear infection, or blocked ears.  How do I do a sinus rinse?  Wash your hands.  Disinfect your device according to the directions provided and then dry it.  Use the solution that comes with your device or one that is sold separately in stores. Follow the mixing directions on the package.  Fill your device with the amount of saline solution as directed by the device instructions.  Stand over a sink and tilt your head sideways over the sink.  Place the spout of the device in your upper nostril (the one closer to the ceiling).  Gently pour or squeeze the saline solution into the nasal cavity. The liquid should drain to the lower nostril if you are not overly congested.  Gently  blow your nose. Blowing too hard may cause ear pain.  Repeat in the other nostril.  Clean and rinse your device with clean water and then air-dry it. Are there risks of a sinus rinse? Sinus rinse is generally very safe and effective. However, there are a few risks, which include:  A burning sensation in the sinuses. This may happen if you do not make the saline solution as directed. Make sure to follow all directions when making the saline solution.  Infection from contaminated water. This is rare, but possible.  Nasal irritation.  This information is not intended to replace advice given to you by your health care provider. Make sure you discuss any questions you have with your health care provider. Document Released: 11/07/2013 Document Revised: 03/09/2016 Document Reviewed: 08/28/2013 Elsevier Interactive Patient Education  2017 [employer]

## 2017-06-20 ENCOUNTER — Ambulatory Visit: Payer: PRIVATE HEALTH INSURANCE | Admitting: Infectious Diseases

## 2017-06-20 ENCOUNTER — Encounter: Payer: Self-pay | Admitting: Infectious Diseases

## 2017-06-20 VITALS — BP 139/85 | HR 75 | Temp 97.7°F | Wt 199.0 lb

## 2017-06-20 DIAGNOSIS — R6882 Decreased libido: Secondary | ICD-10-CM | POA: Diagnosis not present

## 2017-06-20 DIAGNOSIS — Z23 Encounter for immunization: Secondary | ICD-10-CM | POA: Diagnosis not present

## 2017-06-20 DIAGNOSIS — Z79899 Other long term (current) drug therapy: Secondary | ICD-10-CM

## 2017-06-20 DIAGNOSIS — Z113 Encounter for screening for infections with a predominantly sexual mode of transmission: Secondary | ICD-10-CM

## 2017-06-20 DIAGNOSIS — B20 Human immunodeficiency virus [HIV] disease: Secondary | ICD-10-CM

## 2017-06-20 NOTE — Progress Notes (Signed)
   Subjective:    Patient ID: Barry Zhang, male    DOB: 09/16/1965, 52 y.o.   MRN: [id number]  HPI 52 yo M with hx of HIV+, leukopenia.  Previously refused colonoscopy (still pushing to have at 55).  He has been on atripla, defers newer art.  Recently given doxy for sinus infection.   HIV 1 RNA Quant  Date Value  06/02/2017 <20 Copies/mL  06/01/2016 <20 NOT DETECTED copies/mL  05/14/2015 <20 copies/mL   CD4 T Cell Abs (/uL)  Date Value  06/02/2017 400  06/01/2016 500  05/14/2015 420    Review of Systems  Constitutional: Negative for appetite change, chills, fever and unexpected weight change.  HENT: Positive for sinus pressure.   Respiratory: Negative for shortness of breath.   Cardiovascular: Negative for leg swelling.  Gastrointestinal: Positive for nausea. Negative for constipation and diarrhea.  Genitourinary: Negative for difficulty urinating.  Psychiatric/Behavioral: Negative for dysphoric mood and sleep disturbance.  Please see HPI. All other systems reviewed and negative.      Objective:   Physical Exam  Constitutional: He is oriented to person, place, and time. He appears well-developed and well-nourished.  HENT:  Mouth/Throat: No oropharyngeal exudate.  Eyes: EOM are normal. Pupils are equal, round, and reactive to light.  Neck: Neck supple.  Cardiovascular: Normal rate, regular rhythm and normal heart sounds.  Pulmonary/Chest: Effort normal and breath sounds normal.  Abdominal: Soft. Bowel sounds are normal. There is no tenderness. There is no rebound.  Musculoskeletal: He exhibits no edema.  Lymphadenopathy:    He has no cervical adenopathy.  Neurological: He is alert and oriented to person, place, and time.  Psychiatric: He has a normal mood and affect.      Assessment & Plan:

## 2017-06-20 NOTE — Addendum Note (Signed)
Addended by: Lorenso CourierMALDONADO, Sadye Kiernan L on: 06/20/2017 04:41 PM   Modules accepted: Orders

## 2017-06-20 NOTE — Assessment & Plan Note (Signed)
Factors to consider- metabolic issues- don't have DM, will check TSH, will check testosterone.  Not depressed, does have work stress. Gets enough sleep.  No recreational drugs. 2 ETOH drinks /month.  Less freqeunt AM erections.

## 2017-06-20 NOTE — Assessment & Plan Note (Signed)
Doing very well.  Does not want to change medications Offered/refused condoms.  Has gotten flu shot.  Offered mening.  Will see back in 18 months.

## 2017-06-21 ENCOUNTER — Telehealth: Payer: Self-pay | Admitting: Infectious Diseases

## 2017-06-21 DIAGNOSIS — R6882 Decreased libido: Secondary | ICD-10-CM

## 2017-06-21 LAB — TESTOSTERONE: TESTOSTERONE: 460 ng/dL (ref 250–827)

## 2017-06-21 LAB — TSH: TSH: 0.73 m[IU]/L (ref 0.40–4.50)

## 2017-06-21 MED ORDER — TADALAFIL 5 MG PO TABS: 5.0000 mg | ORAL_TABLET | Freq: Every day | ORAL | 2 refills | Status: DC | PRN

## 2017-06-21 NOTE — Telephone Encounter (Signed)
Called and let him know that his TSH and his Testosterone were normal.  I offered to have him seen by Uro. He defers.  Will see him back as previously scheduled.  Will give him cialis with copay card.

## 2017-06-23 ENCOUNTER — Other Ambulatory Visit: Payer: Self-pay | Admitting: *Deleted

## 2017-06-23 DIAGNOSIS — B20 Human immunodeficiency virus [HIV] disease: Secondary | ICD-10-CM

## 2017-06-23 MED ORDER — EFAVIRENZ-EMTRICITAB-TENOFOVIR 600-200-300 MG PO TABS: 1.0000 | ORAL_TABLET | Freq: Every day | ORAL | 5 refills | Status: DC

## 2017-08-09 ENCOUNTER — Telehealth: Payer: Self-pay | Admitting: *Deleted

## 2017-08-09 MED ORDER — EPINEPHRINE 0.3 MG/0.3ML IJ SOAJ: 0.3000 mg | INTRAMUSCULAR | 1 refills | Status: DC | PRN

## 2017-08-09 NOTE — Telephone Encounter (Signed)
Requests refill Epi-pen. No refills available. Last ordered in epic system in 2015. Preferred pharmacy confirmed.

## 2017-08-09 NOTE — Telephone Encounter (Signed)
Noted  Chart review Hx allergy bee venom.  Refilled for patient follow up with a provider if he requires use of his epipen.  Signed Rx

## 2017-08-11 NOTE — Telephone Encounter (Signed)
noted 

## 2017-08-11 NOTE — Telephone Encounter (Signed)
Apologies for lack of clarification. Correct for bee venom allergy and pt verbalized this as reason for need for refill at time of request due to more prone to exposure this time of year. Reviewed with pt to follow up with pcp if epi pen use is needed and proceed to ED if sx persist and/or more than one dose is necessary. Pt verbalized understanding and agreement at that time.

## 2017-12-11 ENCOUNTER — Other Ambulatory Visit: Payer: Self-pay | Admitting: Infectious Diseases

## 2017-12-11 DIAGNOSIS — B20 Human immunodeficiency virus [HIV] disease: Secondary | ICD-10-CM

## 2018-03-01 ENCOUNTER — Telehealth: Payer: Self-pay | Admitting: Behavioral Health

## 2018-03-01 NOTE — Telephone Encounter (Signed)
Patient called today stating her has been battling depression.  Patient states his stressors are work, 2 recent deaths in his family, holidays coming up,, and the weather change.  Patient states he was wondering if there was a medication that could be prescribed for his depression.  Patient denies suicidal ideations and states he can call his brother if it gets worse or if the thoughts come about.  Offered for patient to come in to see [name] today however patient refused and states he has been seeing a Veterinary surgeon at work.  Patient main request it to make Dr. Ninetta Lights aware and to see if there is a prescription that could be called in to help.  Also informed him Delfino Lovett heath is available along with the suicide hotline phone number.  Patient took this information but states he wants to hear from Dr. Ninetta Lights. Angeline Slim RN

## 2018-03-02 ENCOUNTER — Telehealth: Payer: Self-pay | Admitting: Infectious Diseases

## 2018-03-02 DIAGNOSIS — F329 Major depressive disorder, single episode, unspecified: Secondary | ICD-10-CM

## 2018-03-02 MED ORDER — SERTRALINE HCL 25 MG PO TABS: 25.0000 mg | ORAL_TABLET | Freq: Every day | ORAL | 1 refills | Status: DC

## 2018-03-02 NOTE — Telephone Encounter (Signed)
Pt's phone calls noted.  Called pt and left VM with my cell phone [phone].  Also called his brother's #. Left VM

## 2018-03-02 NOTE — Telephone Encounter (Signed)
Will call thanks

## 2018-03-02 NOTE — Telephone Encounter (Signed)
Patient called again this am to see if Dr. Ninetta Lights was made aware of his depression.  Explained to him that he was and there should be f/u soon.  Patient states he feels slightly better today.  Patient states, "I know there is hope, and I know there is help."  Patient states he knows there are resources available.  Writer reiterated cal the office if he needs [place] and he stated he will.  Patient denied Suicidal ideation as well. Angeline Slim RN

## 2018-03-02 NOTE — Telephone Encounter (Signed)
Pt called back . Has been having 2-3 weeks worsening depression. He has seen on-site counselor a work English as a second language teacher) several times/weekly.  Has had 2 deaths in family in last 2 months Has good friend having heart surgery next week.  When younger was on antidepressant- zoloft or prozac. In [place] school. Stopped in high school. Sleeping ok Eating ok Had a date 2 weeks ago. Made him feel more isolated.   Plan Will continue with work counselor weekly Start zoloft 25mg  qday Does not feel hopeless. Denies si [employer] Consider changing off atripla at next visit F/u visit in next 2 months

## 2018-03-02 NOTE — Telephone Encounter (Signed)
Please see phone note thanks

## 2018-03-13 ENCOUNTER — Telehealth: Payer: Self-pay | Admitting: Behavioral Health

## 2018-03-13 NOTE — Telephone Encounter (Signed)
Patient came in today to drop off FMLA paperwork for Dr. Ninetta LightsHatcher.  Made patient an appointment with Dr. Ninetta LightsHatcher tomorrow to go over paperwork.  Paperwork placed in Dr. Moshe CiproHatcher's box and copy made and placed in Triage. Angeline SlimAshley Hill RN

## 2018-03-14 ENCOUNTER — Encounter: Payer: Self-pay | Admitting: Infectious Diseases

## 2018-03-14 ENCOUNTER — Ambulatory Visit (INDEPENDENT_AMBULATORY_CARE_PROVIDER_SITE_OTHER): Payer: PRIVATE HEALTH INSURANCE | Admitting: Licensed Clinical Social Worker

## 2018-03-14 ENCOUNTER — Ambulatory Visit (INDEPENDENT_AMBULATORY_CARE_PROVIDER_SITE_OTHER): Payer: PRIVATE HEALTH INSURANCE | Admitting: Infectious Diseases

## 2018-03-14 VITALS — BP 117/79 | HR 75 | Temp 97.9°F | Wt 207.8 lb

## 2018-03-14 DIAGNOSIS — B20 Human immunodeficiency virus [HIV] disease: Secondary | ICD-10-CM

## 2018-03-14 DIAGNOSIS — F331 Major depressive disorder, recurrent, moderate: Secondary | ICD-10-CM | POA: Diagnosis not present

## 2018-03-14 DIAGNOSIS — F329 Major depressive disorder, single episode, unspecified: Secondary | ICD-10-CM | POA: Diagnosis not present

## 2018-03-14 DIAGNOSIS — F341 Dysthymic disorder: Secondary | ICD-10-CM

## 2018-03-14 DIAGNOSIS — Z23 Encounter for immunization: Secondary | ICD-10-CM | POA: Diagnosis not present

## 2018-03-14 MED ORDER — SERTRALINE HCL 50 MG PO TABS: 50.0000 mg | ORAL_TABLET | Freq: Every day | ORAL | 2 refills | Status: DC

## 2018-03-14 NOTE — Assessment & Plan Note (Signed)
Would change him to dovato but he does not want to go off atripla. Does not think he is having sfx.  Flu and PCV today.  Rtc in 1 month.

## 2018-03-14 NOTE — Assessment & Plan Note (Signed)
Will increase zoloft to 50mg  Will have him seen by regina.

## 2018-03-14 NOTE — Progress Notes (Signed)
   Subjective:    Patient ID: Barry Zhang, male    DOB: 03/10/1966, 52 y.o.   MRN: [id number]  HPI 52 yo M with hx of HIV+, leukopenia.  Previously refused colonoscopy (still pushing to have at 55).  He has been on atripla, defers newer art.   Today is tearful. "All I do is cry and sleep". He was re-started on his zoloft and noticed slight improvement. He has felt overwhelmed by "everything". Had Aunt and Uncle die in fall. Grandmother had to be put in SNF. Feels alone (has brother and dad). His closest friend had triple bypass last week.  Feels overwhelmed by work. Feels overwhelmed by going to grocery store. Wants to stay in bed all day.  Has been sleeping well. Has been eating well. Wt up 8# since February. Has been going to gym but less frequently.    HIV 1 RNA Quant  Date Value  06/02/2017 <20 Copies/mL  06/01/2016 <20 NOT DETECTED copies/mL  05/14/2015 <20 copies/mL   CD4 T Cell Abs (/uL)  Date Value  06/02/2017 400  06/01/2016 500  05/14/2015 420    Review of Systems  Constitutional: Negative for appetite change and unexpected weight change.  Gastrointestinal: Negative for constipation and diarrhea.  Genitourinary: Negative for difficulty urinating.  Psychiatric/Behavioral: Positive for decreased concentration and dysphoric mood.  he has had thoughts of self injury, knows this is not the solution, is in the "far, far back of his mind".  Support is his brother. He is reliable. For thanksgiving is going to father and step-mother's house. Please see HPI. All other systems reviewed and negative.     Objective:   Physical Exam  Constitutional: He is oriented to person, place, and time. He appears well-developed and well-nourished.  HENT:  Mouth/Throat: No oropharyngeal exudate.  Eyes: Pupils are equal, round, and reactive to light. EOM are normal.  Neck: Normal range of motion. Neck supple.  Cardiovascular: Normal rate, regular rhythm and normal heart sounds.    Pulmonary/Chest: Effort normal and breath sounds normal.  Abdominal: Soft. Bowel sounds are normal. He exhibits no distension. There is no tenderness.  Musculoskeletal: Normal range of motion.  Lymphadenopathy:    He has no cervical adenopathy.  Neurological: He is alert and oriented to person, place, and time.  Psychiatric: He exhibits a depressed mood. He expresses no suicidal plans.          Assessment & Plan:

## 2018-03-14 NOTE — Addendum Note (Signed)
Addended by: Lorenso CourierMALDONADO, JOSE L on: 03/14/2018 01:56 PM   Modules accepted: Orders

## 2018-03-14 NOTE — BH Specialist Note (Signed)
Integrated Behavioral Health Initial Visit  MRN: [id number] Name: Barry Zhang  Number of Integrated Behavioral Health Clinician visits:: 1/6 Session Start time: 11:40am  Session End time: 12:12pm Total time: 30 minutes  Type of Service: Integrated Behavioral Health- Individual/Family Interpretor:No. Interpretor Name and Language: n/a   Warm Hand Off Completed.       SUBJECTIVE: Barry Zhang is a 52 y.o. male accompanied by self Patient was referred by Dr. Ninetta LightsHatcher  for depressive symptoms. Patient reports the following symptoms/concerns: lack of motivation, sleeping more than usual, crying spells, hopeless thoughts, some thoughts of self-harm Duration of problem: about a month; Severity of problem: moderate  OBJECTIVE: Mood: Depressed and Affect: Tearful Risk of harm to self or others: No plan to harm self or others; did have some suicidal thoughts a couple of weeks ago   LIFE CONTEXT: Patient lives alone and works at [employer] He enjoys his job very much, but reports that he feels a lot of pressure around it. In the past few months, he lost an aunt and an uncle, his best friend had a stroke, and he had to put his pet to sleep. Patient reports that his brother is his greatest support right now, though he has also had supportive conversations with his boss and the staff counselor at work. He indicates that he was dating someone recently, and while it was short-lived and  they decided mutually that it was not a compatible relationship, this has made patient realize he wants to pursue a romantic relationship again.   GOALS ADDRESSED: Patient will: 1. Reduce symptoms of: depression  INTERVENTIONS: Interventions utilized: Motivational Interviewing, Brief CBT and Supportive Counseling   ASSESSMENT: Patient currently experiencing low motivation, hypersomnia, crying spells, feelings of hopelessness, loneliness, thoughts of death/dying. The most consistent diagnosis  for these symptoms is Major Depressive Disorder, and patient reports that he has had these episodes before.   Counselor normalized patient's feelings of sadness given the many recent losses. Counselor explored with patient what he would like to see be different in his life. Patient reports that he would like to feel more in control of his emotions and be better able to function in his roles at home and work. Counselor educated patient on the dialectics of change and acceptance. Counselor guided patient in identifying what feelings/behaviors he can change and how. He identified reading to distract himself, creating routine to keep from lying in bed a lot, and watching funny tv shows to make him laugh. Counselor guided patient to identify ways that he can accept his feelings and situations. Patient was able to identify that he is supposed to be sad after losses, and that he can learn to sit with these feelings. Counselor and patient brainstormed ways to handle sitting with difficult feelings, including scheduling sessions to focus on these things, then distracting himself by engaging with other activities. Counselor explored with patient how changing his behaviors and thought processes can impact his feelings, etc. Patient was able to identify some daily tasks he can do (behavior) that will impact his emotions and ways that he can reframe his thoughts to help him feel more in control of his life.   Patient has FMLA papers from his doctor which request accomodation for an hour of counseling each week. He reports that he will stay home from work one more day, and then when he returns will talk to his boss about leaving early one day per week to get to counseling.   Patient may  benefit from ongoing weekly CBT sessions.  PLAN: 1. Follow up with behavioral health clinician on : 03/15/18 @ 3pm   Angus Palms, LCSW

## 2018-03-15 ENCOUNTER — Ambulatory Visit (INDEPENDENT_AMBULATORY_CARE_PROVIDER_SITE_OTHER): Payer: PRIVATE HEALTH INSURANCE | Admitting: Licensed Clinical Social Worker

## 2018-03-15 DIAGNOSIS — F331 Major depressive disorder, recurrent, moderate: Secondary | ICD-10-CM

## 2018-03-16 ENCOUNTER — Encounter: Payer: Self-pay | Admitting: Infectious Diseases

## 2018-03-16 ENCOUNTER — Ambulatory Visit (INDEPENDENT_AMBULATORY_CARE_PROVIDER_SITE_OTHER): Payer: PRIVATE HEALTH INSURANCE | Admitting: Licensed Clinical Social Worker

## 2018-03-16 DIAGNOSIS — F331 Major depressive disorder, recurrent, moderate: Secondary | ICD-10-CM | POA: Diagnosis not present

## 2018-03-16 NOTE — Progress Notes (Signed)
To whom it may concern This letter is written for Barry Zhang (06/21/1965).  He is suffering from depression and needs to take a 2 week leave of absence effective immediately. He will be meeting with our counselor during this period.  He will return to work on 03-30-2018

## 2018-03-16 NOTE — BH Specialist Note (Signed)
Integrated Behavioral Health Comprehensive Clinical Assessment  MRN: [id number] Name: Barry Zhang  Session Time: 3:00pm - 4:00pm Total time: 1 hour  Type of Service: Integrated Behavioral Health-Individual Interpretor: No. Interpretor Name and Language: n/a  PRESENTING CONCERNS: Barry Zhang is a 52 y.o. male accompanied by self. Barry Zhang was referred to [facility] clinician for depression  Previous mental health services Have you ever been treated for a mental health problem? Yes If "Yes", when were you treated and whom did you see? Workplace counselor Have you ever been hospitalized for mental health treatment? No Have you ever been treated for any of the following? Anxiety: No Bipolar Disorder: No Depression: Yes Mania: No Psychosis: No Schizophrenia: No Personality Disorder: No Hospitalization for psychiatric illness: No History of Electroconvulsive Shock Therapy: No Prior Suicide Attempts: No Have you ever had thoughts of harming yourself or others or attempted suicide? No plan to harm self or others  Medical history  has a past medical history of HIV (human immunodeficiency virus infection) (HCC). Primary Care Physician: Ginnie Smart, MD  Allergies:  Allergies  Allergen Reactions  . Bee Venom    Current medications:  Outpatient Encounter Medications as of 03/15/2018  Medication Sig  . ATRIPLA 600-200-300 MG tablet TAKE 1 TABLET BY MOUTH EVERY NIGHT AT BEDTIME  . doxycycline (VIBRA-TABS) 100 MG tablet Take 1 tablet (100 mg total) by mouth 2 (two) times daily. (Patient not taking: Reported on 06/20/2017)  . EPINEPHrine (EPIPEN) 0.3 mg/0.3 mL IJ SOAJ injection Inject 0.3 mLs (0.3 mg total) into the muscle as needed.  . fluticasone (FLONASE) 50 MCG/ACT nasal spray Place 1 spray into both nostrils 2 (two) times daily as needed for allergies or rhinitis. (Patient not taking: Reported on 06/20/2017)  . sertraline (ZOLOFT) 50 MG  tablet Take 1 tablet (50 mg total) by mouth daily.  . sodium chloride (OCEAN) 0.65 % SOLN nasal spray Place 2 sprays into both nostrils every 2 (two) hours as needed for congestion.  . tadalafil (CIALIS) 5 MG tablet Take 1 tablet (5 mg total) by mouth daily as needed for erectile dysfunction.   No facility-administered encounter medications on file as of 03/15/2018.    Have you ever had any serious medication reactions? No Is there any history of mental health problems or substance abuse in your family? No Has anyone in your family been hospitalized for mental health treatment? No  Social/family history Who lives in your current household? Lives alone What is your family of origin, childhood history? Grew up in [place], [place],  with both parents until the death of his mother years ago; father still lives in [place], as do many extended family members,  'never for a moment felt anything less than completely loved", especially by mother but also close to father Do you have siblings, step/half siblings? Yes- one older brother, 4 years pt's senior; has stepsiblings for the past 6-8 months, has only seen them a few time since they are all adults, get along okay but don't really know one another Are your parents separated or divorced? No; father is widowed, just remarried last year; stepmother is Joyce Gross and patient is very fond of her "if my mother had known her they would have been friends and my mother would approve of her as my father's second wife." What are your social supports? 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  Loves his job, though it is very stressful. Reports that she spends a great deal of time solving problems at work. Patient states that he was very close to his old boss, but she no longer works there. His new boss is supportive and tries to be encouraging but is not  always pleasant or positive. Patient was praised by old boss, for example, for always coming in with ideas/suggestions to fix problems and mistake she had made, while new boss gets frustrated at the mistakes. Reports he has a 7 person work group that are all pretty close, there is one person he is closest to, but he has not heard from her during his absence.   Sleep Until the past 2 nights patient has had no problems with sleeping. Lately he is waking up at 4 or 5 in the morning and dreading the day.  Trauma/Abuse history Have you ever experienced or been exposed to any form of abuse? No Have you ever experienced or been exposed to something traumatic? No  Substance use Do you use alcohol, nicotine or caffeine? alcohol intake:social drinker How old were you when you first tasted alcohol? unknown Have you ever used illicit drugs or abused prescription medications? none  Mental status General appearance/Behavior: Neat Eye contact: Good Motor behavior: Normal Speech: Normal Level of consciousness: Alert Mood: Depressed Affect: Depressed Anxiety level: Moderate Thought process: Tangential Thought content: WNL and Obsessions Perception: Normal Judgment: Fair Insight: Present  Diagnosis F33.1  Major Depressive Disorder, Recurrent Episode, Moderate  GOALS ADDRESSED: Patient will reduce symptoms of: depression an Increase healthy adjustment to current life circumstances              INTERVENTIONS: Interventions utilized: Motivational Interviewing and Brief CBT   ASSESSMENT/OUTCOME: Patient currently experiencing low motivation, hypersomnia, crying spells, feelings of hopelessness, loneliness, thoughts of death/dying. He denies any suicidal plans or intent, but states that the passive thoughts have come through from time to time. Counselor and patient processed patient's expectations of returning to work. Patient identified that his main concern is the problems that he will be  expected to deal with/solve. He states that he believes his team will be supportive, but he feels like he may breakdown when confronted with a problem/issue and that he will be disappointing them or not pulling his weight. Counselor pointed out that patient is placing heavy expectations on himself, and challenged him as to whether this is fair/realistic after all the losses he has just endured. Patient talked about how these losses are so different from when he lost his mother, and how he likes to have structure in his life but feels out of balance right now. Counselor emphasized that all grief can be different, and that's okay, and encouraged patient to give himself space to feel what he feels. Counselor inquired about holiday plans, and guided patient in identifying a plan for getting through Thanksgiving, since this is the first year it will not be at his grandmother's house and he will be with stepmother's family instead of his own. Patient was able to identify things he can set up with brother to look forward to after the holiday meal so that he feels a little more in control of at least that part of the day. Counselor educated patient on use of sensory triggers to regain control of thoughts during high anxiety. Counselor and patient explored how this behavior is linked to changing thoughts and feelings.   PLAN: Patient would benefit from weekly cognitive behavioral therapy sessions, but  needs to find out what kind of accommodations his job is willing to make. He will contact therapist to schedule next session once he has returned to work and spoken with HR.   Reita Cliche Counselor

## 2018-03-16 NOTE — Progress Notes (Signed)
Integrated Behavioral Health Follow Up Visit  MRN: [id number] Name: Barry Zhang   Session Start time:8:40am Session End time: 9:10am Total time: 30 minutes  Type of Service: Integrated Behavioral Health- Individual/Family Interpretor:No. Interpretor Name and Language: n/a  SUBJECTIVE: Barry Lickimothy S Michaux is a 52 y.o. male accompanied by self Patient reports the following symptoms/concerns: feeling overwhelmed, trouble sleeping, not wanting to get out of bed, "can't function", anxiety, depressed mood, crying spells  OBJECTIVE: Mood: Depressed and Affect: Depressed Risk of harm to self or others: No plan to harm self or others no SI today  LIFE CONTEXT: Patient reports that he had trouble sleeping last night (until the past 2 nights this has never been a problem). He awoke at [facility] and was "terrified" of going back to work today. In the shower he felt as though he was hyperventilating, and he called his boss and told her that he cannot come to work and function. Patient then called doctor crying and doctor agreed to see him and write him out of work for a bit longer.    GOALS ADDRESSED: Patient will: 1.  Reduce symptoms of: depression    INTERVENTIONS: Interventions utilized:  Solution-Focused Strategies, Brief CBT and Supportive Counseling  ASSESSMENT: Patient currently experiencing feeling overwhelmed, trouble sleeping, not wanting to get out of bed, "can't function", anxiety, depressed mood, crying spells. Counselor processed with patient his decision not to go to work today. Patient stated that he felt good after yesterday's session, but was unable to maintain this and after talking to boss last night about needing a return doctor's note, he was not able to function enough to go to work. He states that in the shower this morning all he could think was that if he could got back to bed he would be okay. Counselor commended patient for coming to clinic rather than returning to bed.  Doctor has written patient out of work for 2 more weeks. Patient's medication has been increased and he should see some effect by this time, according to doctor. Counselor guided patient to identify what he needs to be successful in life in general as well as at work. Patient is very dependent on schedules and organization. Counselor commended him on this, and pointed out that emotions often do not follow schedules. Counselor encouraged patient to listen to his body and make room for his emotions rather than just his reason. Patient discussed struggles with not "pushing through" and again talked about letting down his work group, even though his boss asked him why he was pushing to get back to work before he is ready and encouraged him to take the time for self-care. Counselor emphasized to patient the importance of adding in self-care into his routine while out of work (and when he returns). Counselor and patient explored how self-care does not just mean things he enjoys, but also setting and recognizing his limits/boundaries, taking medications, and living in alignment with his values. Patient and counselor used functional analysis to illustrate how patient's expectations of himself are unrealistic, and to reframe his thoughts into more realistic and healthy expectations.   Patient may benefit from ongoing CBT/Reality Therapy sessions twice per week while on FMLA leave, then weekly.  PLAN: 1. Follow up with behavioral health clinician on : 03/20/18 @ 11am   Angus Palmsegina Anjolina Byrer, LCSW

## 2018-03-20 ENCOUNTER — Ambulatory Visit (INDEPENDENT_AMBULATORY_CARE_PROVIDER_SITE_OTHER): Payer: PRIVATE HEALTH INSURANCE | Admitting: Licensed Clinical Social Worker

## 2018-03-20 DIAGNOSIS — F331 Major depressive disorder, recurrent, moderate: Secondary | ICD-10-CM

## 2018-03-20 NOTE — BH Specialist Note (Signed)
Integrated Behavioral Health Follow Up Visit  MRN: [id number] Name: Sherlyn Lickimothy S Heinemann  Number of Integrated Behavioral Health Clinician visits: 4/6 Session Start time: 11:00am  Session End time: 11:40am Total time: 40 minutes  Type of Service: Integrated Behavioral Health- Individual/Family Interpretor:No. Interpretor Name and Language: n/a  SUBJECTIVE: Sherlyn Lickimothy S Schoeller is a 52 y.o. male accompanied by self Patient reports the following symptoms/concerns: trouble concentrating, depressed mood, blunted affect, low energy  OBJECTIVE: Mood: Depressed and Affect: Blunt Risk of harm to self or others: No plan to harm self or others  LIFE CONTEXT: Patient reports he is no longer feeling guilty about not being at work, the way that he did last week. He states that he heard from his former supervisor, who had emailed him and when she received word that he was out of the office became concerned that this is "not like" him. Patient reports that this and other support has been very uplifting to him. He states that he has a plan for the week, with contingencies for Thanksgiving and support from brother and uncle. Patient is still anxious about all the changes taking place, but feels better able to handle them.   GOALS ADDRESSED: Patient will: 1.  Reduce symptoms of: depression    INTERVENTIONS: Interventions utilized:  Brief CBT and Supportive Counseling  ASSESSMENT: Patient currently experiencing rouble concentrating, depressed mood, blunted affect, and low energy. He reports that he has been going to the gym daily, and over the weekend he visited friend who just had a triple bypass, helped put together friend's lift chair, and did some projects around the house. Counselor praised patient for being productive and active while also allowing himself some space to breathe. Patient shared about his relaxed structure for the remainder of the week, and his plan for Thanksgiving with brother at  dad/stepmom's house, but with the contingency of leaving if it is too hectic and plans to visit uncle later in the day. Counselor and patient explored how patient's very strict schedule has worked for him in the past, but is not working now. Counselor educated patient on systems striving for homeostasis, and pointed out how the changes in his life have shaken up his system. Patient and counselor explored ways that people can respond to change by trying to increase control in other areas of their lives. Counselor pointed out that patient made things more strict and stressful at work as a way of controlling something since his personal life was going through so many changes, and discussed with him the ways that this impacted him negatively. Patient was able to verbalize how structure has always been a safety for him, but now feels unsafe and chaotic. Counselor gave patient permission to relax control and become more flexible to life changes rather than attempting more control. Counselor educated patient on [employer], and discussed with him ways to use this to respond to changes in his life.   Patient may benefit from ongoing CBT and ACT sessions twice weekly while out of work, then weekly when he returns to work fulltime.  PLAN: 1. Follow up with behavioral health clinician on : 03/22/18 @ 2:30   Angus Palmsegina Alexander, LCSW

## 2018-03-21 ENCOUNTER — Telehealth: Payer: Self-pay

## 2018-03-21 NOTE — Telephone Encounter (Signed)
Patient called office regarding paper work for leave of absence. States that [facility] never received this paperwork as discussed in last appointment. Paper work was faxed to our office on 11/19 according to patient. I was unable to find paper work for patient, advised patient to reach out to [facility] to refax information.  Lorenso CourierJose L Maldonado, [place]

## 2018-03-22 ENCOUNTER — Ambulatory Visit (INDEPENDENT_AMBULATORY_CARE_PROVIDER_SITE_OTHER): Payer: PRIVATE HEALTH INSURANCE | Admitting: Licensed Clinical Social Worker

## 2018-03-22 DIAGNOSIS — F331 Major depressive disorder, recurrent, moderate: Secondary | ICD-10-CM | POA: Diagnosis not present

## 2018-03-22 NOTE — Telephone Encounter (Signed)
Faxed paperwork back to [facility] after Dr. Ninetta LightsHatcher filled and signed. Confirmation was received. Will notify patient that paperwork has been faxed as requested. [phone] Lorenso CourierJose L Darrin Apodaca, CMA

## 2018-03-22 NOTE — BH Specialist Note (Signed)
Integrated Behavioral Health Follow Up Visit  MRN: [id number] Name: Barry Zhang  Session Start time: 2:23pm  Session End time: 2:55pm Total time: 30 minutes  Type of Service: Integrated Behavioral Health- Individual/Family Interpretor:No. Interpretor Name and Language: n/a  SUBJECTIVE: Barry Zhang is a 52 y.o. male accompanied by self Patient reports the following symptoms/concerns: anxiety, lower than usual motivation, less energy than usual, depressed mood  OBJECTIVE: Mood: Depressed and Affect: Blunt Risk of harm to self or others: No plan to harm self or others  LIFE CONTEXT: Patient is in the middle of his FMLA time, and reports that he has been sleeping well again. He indicates that he is keeping in contact with his HR department and his supervisor at work regarding his return to [employer] next Friday. Patient has also spent some time with his older brother to build support and comfort. He plans to attend Thanksgiving with stepmother's side of the family tomorrow.   GOALS ADDRESSED: Patient will: 1.  Reduce symptoms of: depression   INTERVENTIONS: Interventions utilized:  Motivational Interviewing and Solution-Focused Strategies  ASSESSMENT: Patient currently experiencing anxiety, depressed mood, low energy and motivation. He reports that he spent Monday evening with his brother, yesterday he went to the gym and got a massage, and today he has read most of the day. This morning, patient states, he did not want to get out of bed but gave himself a time that he needed to, immediately made the bed so he would not climb back in, and went to sit somewhere outside of the bedroom to read. Counselor commended patient on setting boundaries for himself and pushing himself to keep them. Counselor challenged patient to identify what he believes will help him be successful in his return to work next week, and what obstacles there may be to this happening. Patient expressed  concern about coworker's questions inhibiting him from being able to focus and address problems in an externalized manner. Counselor explored with patient ways that he can respond to questions and awkwardness with coworkers, emphasizing to him that it is his information and he does not owe them justification. Patient recognized this, and identified that the information he does and does not want to share. Counselor explored with patient ways that he has handled various small stressors while out of work and encouraged him to use these same techniques to address problems on the job, including not taking them personally, recognizing that discomfort will pass, and speaking up about what he needs to be successful.   Patient may benefit from continued counseling 1-2 times per week until returning to work .  PLAN: 1. Follow up with behavioral health clinician on : 03/27/18   Angus Palmsegina Valor [place], LCSW

## 2018-03-27 ENCOUNTER — Ambulatory Visit (INDEPENDENT_AMBULATORY_CARE_PROVIDER_SITE_OTHER): Payer: PRIVATE HEALTH INSURANCE | Admitting: Licensed Clinical Social Worker

## 2018-03-27 ENCOUNTER — Telehealth: Payer: Self-pay | Admitting: *Deleted

## 2018-03-27 DIAGNOSIS — F331 Major depressive disorder, recurrent, moderate: Secondary | ICD-10-CM | POA: Diagnosis not present

## 2018-03-27 NOTE — Telephone Encounter (Signed)
Would ask for Barry Zhang's input as he has been getting counseling and depression is his reason for out of work.  thanks

## 2018-03-27 NOTE — BH Specialist Note (Signed)
Integrated Behavioral Health Follow Up Visit  MRN: [id number] Name: Barry Lickimothy S Persky    Session Start time: 3:00pm  Session End time: 3:40pm Total time: 40 minutes  Type of Service: Integrated Behavioral Health- Individual/Family Interpretor:No. Interpretor Name and Language: n/a  SUBJECTIVE: Barry Zhang is a 52 y.o. male accompanied by self Patient reports the following symptoms/concerns: depressed mood, decreased motivation and energy  OBJECTIVE: Mood: Depressed and Affect: Appropriate Risk of harm to self or others: No plan to harm self or others  LIFE CONTEXT: Patient reports that Thanksgiving went better than he had expected, and since he was able to handle that he feels better able to handle work and Christmas coming up. He shares that he appreciates the space that time off work has given him, and the ability to process through things that he simply went through in the past. Patient reports feeling ready to return to work on Friday, as planned with Dr. Ninetta LightsHatcher.   GOALS ADDRESSED: Patient will: 1.  Reduce symptoms of: depression   INTERVENTIONS: Interventions utilized:  Solution-Focused Strategies, Brief CBT and Supportive Counseling  ASSESSMENT: Patient currently experiencing some depressed mood, decrease in motivation and energy.  He states that he is having fewer depressed days, and that he has not hand any crying spells at all in the past several days. Counselor commended patient on his progress and processed with him how he has been regulating his emotions. Patient identified that accepting that he cannot be perfect or do everything has been a big help to him. Counselor and patient explored ways that patient has changed his view of himself and his self-talk. Counselor guided patient to share his thoughts and feelings about his return to work. Patient was able to identify that he needs to set better limits, and that he has a plan to do so with boss on his first day  back. He shared his realization that with everything that was going on in his personal life, the stressors of his professional life were creating too much pressure. Now that he has dealt with some of his personal stressors, he recognizes that he needs to delegate and prioritize more at work, so that his pressure there does not "spill over" or add too much to his personal stressors again. Counselor and patient discussed ways to set limits between work and home life.   Patient may benefit from CBT sessions as needed.  PLAN: 1. Patient will follow-up with on-site counselor at work, and will contact [facility] counselor as needed for any further follow up.   Angus Palmsegina Alexander, LCSW

## 2018-03-27 NOTE — Telephone Encounter (Signed)
Patient was in office today and advised his leave ends 03/31/18 and he is to report to work. He needs a note stating he is ok to go back to work. Advised will ask the provider and give him a call once he responds.   Patient needs the note by Thursday 03/30/18 to take to work with him on Friday.

## 2018-03-28 ENCOUNTER — Encounter: Payer: Self-pay | Admitting: Infectious Diseases

## 2018-03-28 NOTE — Progress Notes (Signed)
Letter to return to work written.

## 2018-03-28 NOTE — Telephone Encounter (Signed)
I've been seeing him every couple of days. His mood symptoms are much improved and he has a good plan for returning to work and handling stressors. I would say he's ready to return.

## 2018-03-28 NOTE — Telephone Encounter (Signed)
Will write letter reflecting return to work on 03-31-18.  Ms Dutch Quintoole, could you print and give to pt?  Thanks jeff

## 2018-03-28 NOTE — Telephone Encounter (Addendum)
Letter printed and message left for the patient to pick letter up here at office of provide a fax number to send.

## 2018-03-30 NOTE — Telephone Encounter (Signed)
Per patient request [facility] [phone]. Confirmation of receipt OK.

## 2018-04-27 ENCOUNTER — Ambulatory Visit (INDEPENDENT_AMBULATORY_CARE_PROVIDER_SITE_OTHER): Payer: PRIVATE HEALTH INSURANCE | Admitting: Infectious Diseases

## 2018-04-27 ENCOUNTER — Encounter: Payer: Self-pay | Admitting: Infectious Diseases

## 2018-04-27 VITALS — BP 125/88 | HR 93 | Temp 97.6°F | Ht 70.0 in | Wt 202.0 lb

## 2018-04-27 DIAGNOSIS — Z23 Encounter for immunization: Secondary | ICD-10-CM

## 2018-04-27 DIAGNOSIS — F341 Dysthymic disorder: Secondary | ICD-10-CM

## 2018-04-27 DIAGNOSIS — F329 Major depressive disorder, single episode, unspecified: Secondary | ICD-10-CM

## 2018-04-27 DIAGNOSIS — B20 Human immunodeficiency virus [HIV] disease: Secondary | ICD-10-CM | POA: Diagnosis not present

## 2018-04-27 DIAGNOSIS — Z79899 Other long term (current) drug therapy: Secondary | ICD-10-CM

## 2018-04-27 DIAGNOSIS — J029 Acute pharyngitis, unspecified: Secondary | ICD-10-CM

## 2018-04-27 MED ORDER — AZITHROMYCIN 250 MG PO TABS: ORAL_TABLET | ORAL | 0 refills | Status: DC

## 2018-04-27 NOTE — Addendum Note (Signed)
Addended by: Andree Coss on: 04/27/2018 05:10 PM   Modules accepted: Orders

## 2018-04-27 NOTE — Assessment & Plan Note (Signed)
He is doing much better Continue on SSRI, he may eventually taper off.  [facility]

## 2018-04-27 NOTE — Assessment & Plan Note (Signed)
Will give him rx for anbx. Azithro.  He has tried multiple OTCs

## 2018-04-27 NOTE — Assessment & Plan Note (Signed)
He continues to defer med change Next mening today.  Will check labs then.

## 2018-04-27 NOTE — Progress Notes (Signed)
   Subjective:    Patient ID: Barry Zhang, male    DOB: 07-16-1965, 53 y.o.   MRN: [id number]  HPI 53 yo M with hx of HIV+, leukopenia.  Previously refused colonoscopy (still pushing to have at 55).  He has been on atripla, defers newer art.  He was seen 02-2018 with major depression. He was started on zoloft and was given work excuse. He has had multiple f/u visits with [name].  He remains on zoloft. his mood has been much better. Hasn't "seen regina in a while." Does not want visit today except social visit.  Attributes better mood to - getting past holidays, visits with regina, zoloft.   Today c/o upper airway congestion, itchy eyes. No f/c. Slight (pressure) headaches.  Has been using saline spray, flonase, otc allergy/sinus. Feels fine when resting, but when active feels poorly. Mild non-prod cough.     HIV 1 RNA Quant  Date Value  06/02/2017 <20 Copies/mL  06/01/2016 <20 NOT DETECTED copies/mL  05/14/2015 <20 copies/mL   CD4 T Cell Abs (/uL)  Date Value  06/02/2017 400  06/01/2016 500  05/14/2015 420    Review of Systems  Constitutional: Positive for fatigue. Negative for chills and fever.  HENT: Positive for congestion, postnasal drip and rhinorrhea.   Eyes: Positive for itching.  Respiratory: Positive for cough.   Gastrointestinal: Negative for constipation and diarrhea.  Genitourinary: Negative for difficulty urinating.  Neurological: Positive for headaches.  Psychiatric/Behavioral: Negative for dysphoric mood.       Objective:   Physical Exam Constitutional:      General: He is not in acute distress.    Appearance: Normal appearance. He is not ill-appearing.  HENT:     Nose: Congestion present. No rhinorrhea.     Mouth/Throat:     Mouth: Mucous membranes are moist.     Pharynx: Oropharynx is clear. Posterior oropharyngeal erythema present. No oropharyngeal exudate.  Eyes:     Extraocular Movements: Extraocular movements intact.     Pupils: Pupils  are equal, round, and reactive to light.  Neck:     Musculoskeletal: Normal range of motion and neck supple.  Cardiovascular:     Rate and Rhythm: Normal rate and regular rhythm.  Pulmonary:     Effort: Pulmonary effort is normal.     Breath sounds: Normal breath sounds.  Abdominal:     General: Bowel sounds are normal. There is no distension.     Palpations: Abdomen is soft. There is no mass.  Musculoskeletal:        General: No swelling.  Neurological:     General: No focal deficit present.     Mental Status: He is alert.  Psychiatric:        Mood and Affect: Mood normal.           Assessment & Plan:

## 2018-05-30 ENCOUNTER — Other Ambulatory Visit: Payer: Self-pay | Admitting: Infectious Diseases

## 2018-05-30 DIAGNOSIS — B20 Human immunodeficiency virus [HIV] disease: Secondary | ICD-10-CM

## 2018-08-14 ENCOUNTER — Other Ambulatory Visit: Payer: Self-pay | Admitting: Infectious Diseases

## 2018-08-14 DIAGNOSIS — B20 Human immunodeficiency virus [HIV] disease: Secondary | ICD-10-CM

## 2018-09-19 ENCOUNTER — Other Ambulatory Visit: Payer: Self-pay | Admitting: Infectious Diseases

## 2018-09-19 DIAGNOSIS — R6882 Decreased libido: Secondary | ICD-10-CM

## 2018-10-17 ENCOUNTER — Other Ambulatory Visit (HOSPITAL_COMMUNITY)
Admission: RE | Admit: 2018-10-17 | Discharge: 2018-10-17 | Disposition: A | Discharge status: Home / Self Care | Payer: PRIVATE HEALTH INSURANCE | Source: Ambulatory Visit | Attending: Infectious Diseases | Admitting: Infectious Diseases

## 2018-10-17 ENCOUNTER — Other Ambulatory Visit: Payer: PRIVATE HEALTH INSURANCE

## 2018-10-17 ENCOUNTER — Other Ambulatory Visit: Payer: Self-pay

## 2018-10-17 DIAGNOSIS — Z113 Encounter for screening for infections with a predominantly sexual mode of transmission: Secondary | ICD-10-CM

## 2018-10-17 DIAGNOSIS — B20 Human immunodeficiency virus [HIV] disease: Secondary | ICD-10-CM

## 2018-10-17 DIAGNOSIS — Z79899 Other long term (current) drug therapy: Secondary | ICD-10-CM

## 2018-10-18 LAB — T-HELPER CELL (CD4) - (RCID CLINIC ONLY)
CD4 % Helper T Cell: 41 % (ref 33–65)
CD4 T Cell Abs: 429 /uL (ref 400–1790)

## 2018-10-19 LAB — URINE CYTOLOGY ANCILLARY ONLY
Chlamydia: NEGATIVE
Neisseria Gonorrhea: NEGATIVE

## 2018-10-23 LAB — CBC WITH DIFFERENTIAL/PLATELET
Absolute Monocytes: 412 cells/uL (ref 200–950)
Basophils Absolute: 21 cells/uL (ref 0–200)
Basophils Relative: 0.5 %
Eosinophils Absolute: 50 cells/uL (ref 15–500)
Eosinophils Relative: 1.2 %
HCT: 48.7 % (ref 38.5–50.0)
Hemoglobin: 17 g/dL (ref 13.2–17.1)
Lymphs Abs: 1121 cells/uL (ref 850–3900)
MCH: 32 pg (ref 27.0–33.0)
MCHC: 34.9 g/dL (ref 32.0–36.0)
MCV: 91.5 fL (ref 80.0–100.0)
MPV: 12.3 fL (ref 7.5–12.5)
Monocytes Relative: 9.8 %
Neutro Abs: 2596 cells/uL (ref 1500–7800)
Neutrophils Relative %: 61.8 %
Platelets: 143 10*3/uL (ref 140–400)
RBC: 5.32 10*6/uL (ref 4.20–5.80)
RDW: 12.4 % (ref 11.0–15.0)
Total Lymphocyte: 26.7 %
WBC: 4.2 10*3/uL (ref 3.8–10.8)

## 2018-10-23 LAB — COMPREHENSIVE METABOLIC PANEL
AG Ratio: 2 (calc) (ref 1.0–2.5)
ALT: 28 U/L (ref 9–46)
AST: 18 U/L (ref 10–35)
Albumin: 4.3 g/dL (ref 3.6–5.1)
Alkaline phosphatase (APISO): 85 U/L (ref 35–144)
BUN: 20 mg/dL (ref 7–25)
CO2: 26 mmol/L (ref 20–32)
Calcium: 9.4 mg/dL (ref 8.6–10.3)
Chloride: 109 mmol/L (ref 98–110)
Creat: 1.07 mg/dL (ref 0.70–1.33)
Globulin: 2.2 g/dL (calc) (ref 1.9–3.7)
Glucose, Bld: 83 mg/dL (ref 65–99)
Potassium: 4.5 mmol/L (ref 3.5–5.3)
Sodium: 142 mmol/L (ref 135–146)
Total Bilirubin: 0.4 mg/dL (ref 0.2–1.2)
Total Protein: 6.5 g/dL (ref 6.1–8.1)

## 2018-10-23 LAB — LIPID PANEL
Cholesterol: 183 mg/dL (ref <200)
HDL: 46 mg/dL (ref >=40)
LDL Cholesterol (Calc): 108 mg/dL (calc) — ABNORMAL HIGH
Non-HDL Cholesterol (Calc): 137 mg/dL (calc) — ABNORMAL HIGH (ref <130)
Total CHOL/HDL Ratio: 4 (calc) (ref <5.0)
Triglycerides: 173 mg/dL — ABNORMAL HIGH (ref <150)

## 2018-10-23 LAB — FLUORESCENT TREPONEMAL AB(FTA)-IGG-BLD: Fluorescent Treponemal ABS: REACTIVE — AB

## 2018-10-23 LAB — HIV-1 RNA QUANT-NO REFLEX-BLD
HIV 1 RNA Quant: <20 copies/mL
HIV-1 RNA Quant, Log: <1.3 Log copies/mL

## 2018-10-23 LAB — RPR TITER: RPR Titer: 1:2 {titer} — ABNORMAL HIGH

## 2018-10-23 LAB — SYPHILIS: RPR W/REFLEX TO RPR TITER AND TREPONEMAL ANTIBODIES, TRADITIONAL SCREENING AND DIAGNOSIS ALGORITHM: RPR Ser Ql: REACTIVE — AB

## 2018-10-31 ENCOUNTER — Ambulatory Visit (INDEPENDENT_AMBULATORY_CARE_PROVIDER_SITE_OTHER): Payer: PRIVATE HEALTH INSURANCE | Admitting: Infectious Diseases

## 2018-10-31 ENCOUNTER — Other Ambulatory Visit: Payer: Self-pay

## 2018-10-31 DIAGNOSIS — Z113 Encounter for screening for infections with a predominantly sexual mode of transmission: Secondary | ICD-10-CM

## 2018-10-31 DIAGNOSIS — F341 Dysthymic disorder: Secondary | ICD-10-CM

## 2018-10-31 DIAGNOSIS — Z79899 Other long term (current) drug therapy: Secondary | ICD-10-CM

## 2018-10-31 DIAGNOSIS — B20 Human immunodeficiency virus [HIV] disease: Secondary | ICD-10-CM | POA: Diagnosis not present

## 2018-10-31 NOTE — Progress Notes (Signed)
   Subjective:    Patient ID: Barry Zhang, male    DOB: Dec 17, 1965, 53 y.o.   MRN: [id number]  HPI 53yo M with hx of HIV+. Previously refused colonoscopy (still pushing to have at 38).  He has been on atripla, defers newer art.No problems with ART.  He was seen 02-2018 with major depression. He was started on zoloft and was given work excuse. He has had multiple f/u visits with Rollene Fare.  Working at home.  Has been doing well.  Worried that his temp has been 96.6 last 2 months, he has checked daily at work and orally bid.    HIV 1 RNA Quant  Date Value  10/17/2018 <20 NOT DETECTED copies/mL  06/02/2017 <20 Copies/mL  06/01/2016 <20 NOT DETECTED copies/mL   CD4 T Cell Abs (/uL)  Date Value  10/17/2018 429  06/02/2017 400  06/01/2016 500    Review of Systems  Constitutional: Negative for appetite change, chills, fever and unexpected weight change.  HENT: Negative for congestion.   Respiratory: Negative for cough and shortness of breath.   Gastrointestinal: Negative for constipation and diarrhea.  Genitourinary: Negative for difficulty urinating.   Has not been out except to go to [employer] or work. No sick exposures.  Has been walking, feels like his work is very physical.  Please see HPI. All other systems reviewed and negative.     Objective:   Physical Exam  Phone visit      Assessment & Plan:

## 2018-10-31 NOTE — Assessment & Plan Note (Addendum)
He is doing well We talked about wt management.  Not sexually active.  Defers colon to 61.  Will see him for in person visit in 1 year.  Come in/get at work for flu shot in Sept.

## 2018-10-31 NOTE — Assessment & Plan Note (Addendum)
Mood has been good. Has not f/u with Rollene Fare in a while.  Has been keeping up with people on the phone, walking.  Is off SSRI.

## 2018-11-07 ENCOUNTER — Other Ambulatory Visit: Payer: Self-pay | Admitting: Infectious Diseases

## 2018-11-07 DIAGNOSIS — B20 Human immunodeficiency virus [HIV] disease: Secondary | ICD-10-CM

## 2019-02-07 ENCOUNTER — Telehealth: Payer: Self-pay

## 2019-02-07 NOTE — Telephone Encounter (Signed)
Patient called office today stating that his insurance will no longer cover Atripla, and is requiring patient to switch medication. Patient states that he has enough to last time until this Sunday 10/18. Will forward message to MD to advise.

## 2019-02-08 ENCOUNTER — Telehealth: Payer: Self-pay | Admitting: Infectious Diseases

## 2019-02-08 DIAGNOSIS — B20 Human immunodeficiency virus [HIV] disease: Secondary | ICD-10-CM

## 2019-02-08 MED ORDER — DOVATO 50-300 MG PO TABS: 1.0000 | ORAL_TABLET | Freq: Every day | ORAL | 3 refills | Status: DC

## 2019-02-08 NOTE — Telephone Encounter (Signed)
Called and left message with pt letting him know we would like to change him to dovato.  Asked him to call back and let us know that that is ok.  thanks

## 2019-02-09 ENCOUNTER — Other Ambulatory Visit: Payer: Self-pay | Admitting: *Deleted

## 2019-02-09 ENCOUNTER — Telehealth: Payer: Self-pay

## 2019-02-09 DIAGNOSIS — B20 Human immunodeficiency virus [HIV] disease: Secondary | ICD-10-CM

## 2019-02-09 MED ORDER — DOVATO 50-300 MG PO TABS: 1.0000 | ORAL_TABLET | Freq: Every day | ORAL | 3 refills | Status: DC

## 2019-02-09 NOTE — Telephone Encounter (Signed)
Received fax from [facility] to initiate Prior Auth for Barry Zhang to have the [place] approved: Please call plan at [phone] Patient ID# is [id number].   After providing information, asked to call [phone].

## 2019-02-09 NOTE — Telephone Encounter (Signed)
Contacted [phone] to initiate Prior Auth. Patient ID# [id number].    Prompted to go online to [email or web address]  And fill out form. Unable to upload patient chart notes, faxed to number provided online [phone].  Confirmation faxed received 02/09/2019 4:30pm.    Pending approval Fax.     Lenore Cordia, [place]

## 2019-02-09 NOTE — Telephone Encounter (Signed)
Per verbal from Dr Johnnye Sima called the patient to advise he has made the recommendation of Dovato but we have a pharmacist on staff to discuss his options if he would like. Had to leave a message to have him call the office when he is available to schedule or send in the Dovato to try.

## 2019-02-09 NOTE — Telephone Encounter (Signed)
I'm happy to see him Monday in clinic to discuss more options!

## 2019-02-13 NOTE — Telephone Encounter (Signed)
Fax received on 02/13/2019: Dovato Approved from 02/12/2019 to 02/11/2020.   Lenore Cordia, [place]

## 2019-07-23 ENCOUNTER — Ambulatory Visit: Payer: PRIVATE HEALTH INSURANCE | Attending: Internal Medicine

## 2019-07-23 DIAGNOSIS — Z23 Encounter for immunization: Secondary | ICD-10-CM

## 2019-07-23 NOTE — Progress Notes (Signed)
° °  Covid-19 Vaccination Clinic  Name:  Barry Zhang    MRN: [id number] DOB: [birth year]  07/23/2019  [name] was observed post Covid-19 immunization for 30 minutes based on pre-vaccination screening without incident. He was provided with Vaccine Information Sheet and instruction to access the V-Safe system.   [name] was instructed to call 911 with any severe reactions post vaccine:  Difficulty breathing   Swelling of face and throat   A fast heartbeat   A bad rash all over body   Dizziness and weakness   Immunizations Administered    Name Date Dose VIS Date Route   Pfizer COVID-19 Vaccine 07/23/2019  1:07 PM 0.3 mL 04/06/2019 Intramuscular   Manufacturer: [employer], [employer]   Lot: [id number]   NDC: [address]-7

## 2019-07-31 ENCOUNTER — Ambulatory Visit: Payer: Self-pay | Admitting: Registered Nurse

## 2019-07-31 ENCOUNTER — Other Ambulatory Visit: Payer: Self-pay

## 2019-07-31 ENCOUNTER — Encounter: Payer: Self-pay | Admitting: Registered Nurse

## 2019-07-31 VITALS — BP 140/90 | Temp 97.8°F

## 2019-07-31 DIAGNOSIS — L509 Urticaria, unspecified: Secondary | ICD-10-CM

## 2019-07-31 MED ORDER — AQUAPHOR EX OINT: TOPICAL_OINTMENT | CUTANEOUS | 0 refills | Status: DC | PRN

## 2019-07-31 MED ORDER — CETIRIZINE HCL 10 MG PO TABS: 10.0000 mg | ORAL_TABLET | Freq: Two times a day (BID) | ORAL | 11 refills | Status: DC | PRN

## 2019-07-31 MED ORDER — PREDNISONE 10 MG (21) PO TBPK: ORAL_TABLET | ORAL | 0 refills | Status: DC

## 2019-07-31 MED ORDER — DIPHENHYDRAMINE HCL 2 % EX GEL: 1.0000 "application " | Freq: Two times a day (BID) | CUTANEOUS | Status: AC | PRN

## 2019-07-31 NOTE — Progress Notes (Signed)
Subjective:    Patient ID: Barry Zhang, male    DOB: [birth year], 54 y.o.   MRN: [id number]  53y/o caucasian male established patient c/o itchy rash starting on hands/arms and back of neck.  Denied gardening, insect stings, trauma, new hygiene products or laundry detergents/soaps.  Itching on fingers/rash noticed at first.  Last to appear back of his neck today.  Itching bothering him the most and distracting him at work today.  Has noticed skin on hands in patches getting dry and scaly also with some bumps.  Denied dyspnea/shortness of breath/visual changes/dysphagia/dysphasia.     Review of Systems  Constitutional: Negative for activity change, appetite change, chills, diaphoresis, fatigue, fever and unexpected weight change.  HENT: Negative for facial swelling, trouble swallowing and voice change.   Eyes: Negative for photophobia, pain, discharge, redness, itching and visual disturbance.  Respiratory: Negative for cough, choking, chest tightness and shortness of breath.   Cardiovascular: Negative for chest pain, palpitations and leg swelling.  Gastrointestinal: Negative for constipation, diarrhea, nausea and vomiting.  Endocrine: Negative for cold intolerance and heat intolerance.  Genitourinary: Negative for difficulty urinating.  Musculoskeletal: Negative for arthralgias, back pain, gait problem, joint swelling, myalgias, neck pain and neck stiffness.  Skin: Positive for color change and rash. Negative for pallor and wound.  Allergic/Immunologic: Positive for environmental allergies. Negative for food allergies.  Neurological: Negative for dizziness, tremors, seizures, syncope, facial asymmetry, speech difficulty, weakness, light-headedness, numbness and headaches.  Hematological: Negative for adenopathy. Does not bruise/bleed easily.  Psychiatric/Behavioral: Negative for agitation, confusion and sleep disturbance.       Objective:   Physical Exam Vitals and nursing note  reviewed.  Constitutional:      General: He is awake. He is not in acute distress.    Appearance: Normal appearance. He is well-developed, well-groomed and normal weight. He is not ill-appearing, toxic-appearing or diaphoretic.  HENT:     Head: Normocephalic and atraumatic.     Jaw: There is normal jaw occlusion.     Salivary Glands: Right salivary gland is not diffusely enlarged or tender. Left salivary gland is not diffusely enlarged or tender.     Right Ear: Hearing and external ear normal.     Left Ear: Hearing and external ear normal.     Nose: Nose normal. No signs of injury, laceration, congestion or rhinorrhea.     Mouth/Throat:     Lips: Pink. No lesions.     Mouth: Mucous membranes are moist. No lacerations, oral lesions or angioedema.     Dentition: No gum lesions.     Pharynx: Oropharynx is clear.  Eyes:     General: Lids are normal. Vision grossly intact. Gaze aligned appropriately. Allergic shiner present. No visual field deficit or scleral icterus.       Right eye: No discharge.        Left eye: No discharge.     Extraocular Movements: Extraocular movements intact.     Conjunctiva/sclera: Conjunctivae normal.     Pupils: Pupils are equal, round, and reactive to light.  Neck:     Trachea: Trachea normal.  Cardiovascular:     Rate and Rhythm: Normal rate and regular rhythm.     Pulses: Normal pulses.          Radial pulses are 2+ on the right side and 2+ on the left side.     Heart sounds: Normal heart sounds. No murmur. No gallop.   Pulmonary:     Effort: Pulmonary effort  is normal. No respiratory distress.     Breath sounds: Normal breath sounds and air entry. No stridor, decreased air movement or transmitted upper airway sounds. No decreased breath sounds, wheezing, rhonchi or rales.     Comments: Wearing cloth mask due to covid 19 pandemic; spoke full sentences without difficulty; no cough observed in exam room Abdominal:     General: Abdomen is flat.      Palpations: Abdomen is soft.  Musculoskeletal:        General: Tenderness present. No swelling, deformity or signs of injury. Normal range of motion.     Right shoulder: Normal.     Left shoulder: Normal.     Right elbow: Normal.     Left elbow: Normal.     Right hand: Normal.     Left hand: Normal.     Cervical back: Normal, normal range of motion and neck supple. No swelling, edema, deformity, erythema, signs of trauma, lacerations, rigidity, torticollis or crepitus. No pain with movement. Normal range of motion.     Thoracic back: Normal.     Lumbar back: Normal.     Right hip: Normal.     Left hip: Normal.     Right knee: Normal.     Left knee: Normal.     Right lower leg: No edema.     Left lower leg: No edema.  Lymphadenopathy:     Head:     Right side of head: No submental, submandibular or preauricular adenopathy.     Left side of head: No submental, submandibular or preauricular adenopathy.     Cervical: No cervical adenopathy.     Right cervical: No superficial cervical adenopathy.    Left cervical: No superficial cervical adenopathy.  Skin:    General: Skin is warm and dry.     Capillary Refill: Capillary refill takes less than 2 seconds.     Coloration: Skin is not ashen, cyanotic, jaundiced, mottled, pale or sallow.     Findings: Abrasion, erythema and rash present. No abscess, acne, bruising, burn, ecchymosis, signs of injury, laceration, lesion, petechiae or wound. Rash is macular, papular and urticarial. Rash is not crusting, nodular, purpuric, pustular, scaling or vesicular.     Nails: There is no clubbing.       Neurological:     General: No focal deficit present.     Mental Status: He is alert and oriented to person, place, and time. Mental status is at baseline.     GCS: GCS eye subscore is 4. GCS verbal subscore is 5. GCS motor subscore is 6.     Cranial Nerves: Cranial nerves are intact. No cranial nerve deficit, dysarthria or facial asymmetry.     Sensory:  Sensation is intact. No sensory deficit.     Motor: Motor function is intact. No weakness, tremor, atrophy, abnormal muscle tone or seizure activity.     Coordination: Coordination is intact. Coordination normal.     Gait: Gait is intact. Gait normal.     Comments: Gait sure and steady in hallway; in/out of chair and on/off exam table without difficulty; bilateral hand grasp equal 5/5  Psychiatric:        Attention and Perception: Attention and perception normal.        Mood and Affect: Mood and affect normal.        Speech: Speech normal.        Behavior: Behavior normal. Behavior is cooperative.        Thought Content:  Thought content normal.        Cognition and Memory: Cognition and memory normal.        Judgment: Judgment normal.           Assessment & Plan:  A-urticaria acute  P-last prednisone use 2017 per paper chart review and he required 2 day each 60/50/40/30/20/10mg  taper.  Denied recent illness/gardening/new home/self care product use.  Dispensed prednisone 10mg  po taper with breakfast (60x2, 50x2, 40x2, 30x2, 20x2, 10x2days) #42 RF0 from [facility] to patient today.  Take first dose now.  Discussed possible side effects increased BP/HR/blood sugar, insomnia with patient. Given claritin 10mg  generic po x 1 in clinic and given 4 more UD to take until he can get to store/pharmacy to purchase zyrtec generic 10mg  po BID prn itching.   calagel thin smear BID prn itching given 4 UD from clinic stock; do not get in eyes; if worsening with calagel use stop and will consider trial hydrocortisone 1% topical BID small smear affected areas.  Wash hands before and after application.  Avoid hot steam showers.  Apply emollient twice a day e.g. Fragrance free vaseline or aquaphor ointment given 4 UD from clinic stock.  May apply ice/cold compress 5 minutes QID prn itching/swelling. Avoid rubbing affected areas may apply ointment/gel or ice.   Discussed have been exposed to  cleaners/chemicals/allergens/irirtants that transferred from items to hands then body/clothing due to pandemic work cleaning regimens.  Shower when he finishes work/prior to bedtime.  Avoid harsh/abrasive soaps use fragrance free/sensitive like dove/cetaphil.    Medication as directed. Call or return to clinic as needed if these symptoms worsen or fail to improve as anticipated. Exitcare handout on urticaria and hives printed and given to patient  Follow up for re-evaluation in 48 hours if no improvement and/or worsening of rash with plan of care. Discussed he can email me via [email or web address] due to clinic closed tomorrow if questions or concerns.  Patient verbalized agreement and understanding of treatment plan and had no further questions at this time

## 2019-07-31 NOTE — Patient Instructions (Signed)
Hives Hives (urticaria) are itchy, red, swollen areas on the skin. Hives can appear on any part of the body. Hives often fade within 24 hours (acute hives). Sometimes, new hives appear after old ones fade and the cycle can continue for several days or weeks (chronic hives). Hives do not spread from person to person (are not contagious). Hives come from the body's reaction to something a person is allergic to (allergen), something that causes irritation, or various other triggers. When a person is exposed to a trigger, his or her body releases a chemical (histamine) that causes redness, itching, and swelling. Hives can appear right after exposure to a trigger or hours later. What are the causes? This condition may be caused by:  Allergies to foods or ingredients.  Insect bites or stings.  Exposure to pollen or pets.  Contact with latex or chemicals.  Spending time in sunlight, heat, or cold (exposure).  Exercise.  Stress.  Certain medicines. You can also get hives from other medical conditions and treatments, such as:  Viruses, including the common cold.  Bacterial infections, such as urinary tract infections and strep throat.  Certain medicines.  Allergy shots.  Blood transfusions. Sometimes, the cause of this condition is not known (idiopathic hives). What increases the risk? You are more likely to develop this condition if you:  Are a woman.  Have food allergies, especially to citrus fruits, milk, eggs, peanuts, tree nuts, or shellfish.  Are allergic to: ? Medicines. ? Latex. ? Insects. ? Animals. ? Pollen. What are the signs or symptoms? Common symptoms of this condition include raised, itchy, red or white bumps or patches on your skin. These areas may:  Become large and swollen (welts).  Change in shape and location, quickly and repeatedly.  Be separate hives or connect over a large area of skin.  Sting or become painful.  Turn white when pressed in the  center (blanch). In severe cases, yourhands, feet, and face may also become swollen. This may occur if hives develop deeper in your skin. How is this diagnosed? This condition may be diagnosed by your symptoms, medical history, and physical exam.  Your skin, urine, or blood may be tested to find out what is causing your hives and to rule out other health issues.  Your health care provider may also remove a small sample of skin from the affected area and examine it under a microscope (biopsy). How is this treated? Treatment for this condition depends on the cause and severity of your symptoms. Your health care provider may recommend using cool, wet cloths (cool compresses) or taking cool showers to relieve itching. Treatment may include:  Medicines that help: ? Relieve itching (antihistamines). ? Reduce swelling (corticosteroids). ? Treat infection (antibiotics).  An injectable medicine (omalizumab). Your health care provider may prescribe this if you have chronic idiopathic hives and you continue to have symptoms even after treatment with antihistamines. Severe cases may require an emergency injection of adrenaline (epinephrine) to prevent a life-threatening allergic reaction (anaphylaxis). Follow these instructions at home: Medicines  Take and apply over-the-counter and prescription medicines only as told by your health care provider.  If you were prescribed an antibiotic medicine, take it as told by your health care provider. Do not stop using the antibiotic even if you start to feel better. Skin care  Apply cool compresses to the affected areas.  Do not scratch or rub your skin. General instructions  Do not take hot showers or baths. This can make itching   worse.  Do not wear tight-fitting clothing.  Use sunscreen and wear protective clothing when you are outside.  Avoid any substances that cause your hives. Keep a journal to help track what causes your hives. Write  down: ? What medicines you take. ? What you eat and drink. ? What products you use on your skin.  Keep all follow-up visits as told by your health care provider. This is important. Contact a health care provider if:  Your symptoms are not controlled with medicine.  Your joints are painful or swollen. Get help right away if:  You have a fever.  You have pain in your abdomen.  Your tongue or lips are swollen.  Your eyelids are swollen.  Your chest or throat feels tight.  You have trouble breathing or swallowing. These symptoms may represent a serious problem that is an emergency. Do not wait to see if the symptoms will go away. Get medical help right away. Call your local emergency services (911 in the [place] Do not drive yourself to the hospital. Summary  Hives (urticaria) are itchy, red, swollen areas on your skin. Hives come from the body's reaction to something a person is allergic to (allergen), something that causes irritation, or various other triggers.  Treatment for this condition depends on the cause and severity of your symptoms.  Avoid any substances that cause your hives. Keep a journal to help track what causes your hives.  Take and apply over-the-counter and prescription medicines only as told by your health care provider.  Keep all follow-up visits as told by your health care provider. This is important. This information is not intended to replace advice given to you by your health care provider. Make sure you discuss any questions you have with your health care provider. Document Revised: 10/26/2017 Document Reviewed: 10/26/2017 Elsevier Patient Education  2020 [employer]. Urticaria (Hives)   Cetirizine (Zyrtec) 10 mg once a day.  If symptoms continue then increase to...  Cetirizine (Zyrtec) 10 mg twice a day. If symptoms continue than increase to...  Cetirizine (Zyrtec) 10 mg twice a day and Ranitidine (Zantac) 150 mg once a day.  If symptoms continue  then increase to...  Cetirizine (Zyrtec) 10 mg twice a day and Ranitidine (Zantac) 150 mg twice a day  May use Benadryl as needed for breakthrough symptoms.  If no symptoms for 7 days, then step down dosage.

## 2019-08-13 ENCOUNTER — Emergency Department (HOSPITAL_COMMUNITY)
Admission: EM | Admit: 2019-08-13 | Discharge: 2019-08-13 | Disposition: A | Discharge status: Home / Self Care | Payer: No Typology Code available for payment source | Attending: Emergency Medicine | Admitting: Emergency Medicine

## 2019-08-13 ENCOUNTER — Other Ambulatory Visit: Payer: Self-pay

## 2019-08-13 ENCOUNTER — Ambulatory Visit: Payer: Self-pay | Admitting: *Deleted

## 2019-08-13 ENCOUNTER — Telehealth: Payer: Self-pay | Admitting: Registered Nurse

## 2019-08-13 ENCOUNTER — Emergency Department (HOSPITAL_COMMUNITY): Payer: No Typology Code available for payment source

## 2019-08-13 ENCOUNTER — Encounter: Payer: Self-pay | Admitting: Registered Nurse

## 2019-08-13 VITALS — BP 178/106 | HR 90 | Temp 98.2°F

## 2019-08-13 DIAGNOSIS — B20 Human immunodeficiency virus [HIV] disease: Secondary | ICD-10-CM | POA: Insufficient documentation

## 2019-08-13 DIAGNOSIS — Z79899 Other long term (current) drug therapy: Secondary | ICD-10-CM | POA: Diagnosis not present

## 2019-08-13 DIAGNOSIS — R0602 Shortness of breath: Secondary | ICD-10-CM | POA: Insufficient documentation

## 2019-08-13 DIAGNOSIS — R071 Chest pain on breathing: Secondary | ICD-10-CM | POA: Diagnosis not present

## 2019-08-13 DIAGNOSIS — R519 Headache, unspecified: Secondary | ICD-10-CM

## 2019-08-13 DIAGNOSIS — Z87891 Personal history of nicotine dependence: Secondary | ICD-10-CM | POA: Insufficient documentation

## 2019-08-13 DIAGNOSIS — R079 Chest pain, unspecified: Secondary | ICD-10-CM

## 2019-08-13 DIAGNOSIS — R61 Generalized hyperhidrosis: Secondary | ICD-10-CM

## 2019-08-13 LAB — BRAIN NATRIURETIC PEPTIDE: B Natriuretic Peptide: 14.8 pg/mL (ref 0.0–100.0)

## 2019-08-13 LAB — CBC WITH DIFFERENTIAL/PLATELET
Abs Immature Granulocytes: 0.01 10*3/uL (ref 0.00–0.07)
Basophils Absolute: 0 10*3/uL (ref 0.0–0.1)
Basophils Relative: 0 %
Eosinophils Absolute: 0.1 10*3/uL (ref 0.0–0.5)
Eosinophils Relative: 3 %
HCT: 51.9 % (ref 39.0–52.0)
Hemoglobin: 17.4 g/dL — ABNORMAL HIGH (ref 13.0–17.0)
Immature Granulocytes: 0 %
Lymphocytes Relative: 23 %
Lymphs Abs: 1.2 10*3/uL (ref 0.7–4.0)
MCH: 33 pg (ref 26.0–34.0)
MCHC: 33.5 g/dL (ref 30.0–36.0)
MCV: 98.5 fL (ref 80.0–100.0)
Monocytes Absolute: 0.5 10*3/uL (ref 0.1–1.0)
Monocytes Relative: 10 %
Neutro Abs: 3.4 10*3/uL (ref 1.7–7.7)
Neutrophils Relative %: 64 %
Platelets: 130 10*3/uL — ABNORMAL LOW (ref 150–400)
RBC: 5.27 MIL/uL (ref 4.22–5.81)
RDW: 12.6 % (ref 11.5–15.5)
WBC: 5.3 10*3/uL (ref 4.0–10.5)
nRBC: 0 % (ref 0.0–0.2)

## 2019-08-13 LAB — URINALYSIS, ROUTINE W REFLEX MICROSCOPIC
Bilirubin Urine: NEGATIVE
Glucose, UA: NEGATIVE mg/dL
Hgb urine dipstick: NEGATIVE
Ketones, ur: NEGATIVE mg/dL
Leukocytes,Ua: NEGATIVE
Nitrite: NEGATIVE
Protein, ur: NEGATIVE mg/dL
Specific Gravity, Urine: 1.009 (ref 1.005–1.030)
pH: 5 (ref 5.0–8.0)

## 2019-08-13 LAB — COMPREHENSIVE METABOLIC PANEL
ALT: 33 U/L (ref 0–44)
AST: 26 U/L (ref 15–41)
Albumin: 3.7 g/dL (ref 3.5–5.0)
Alkaline Phosphatase: 73 U/L (ref 38–126)
Anion gap: 11 (ref 5–15)
BUN: 17 mg/dL (ref 6–20)
CO2: 26 mmol/L (ref 22–32)
Calcium: 9 mg/dL (ref 8.9–10.3)
Chloride: 104 mmol/L (ref 98–111)
Creatinine, Ser: 1.5 mg/dL — ABNORMAL HIGH (ref 0.61–1.24)
GFR calc Af Amer: >60 mL/min (ref >60)
GFR calc non Af Amer: 52 mL/min — ABNORMAL LOW (ref >60)
Glucose, Bld: 114 mg/dL — ABNORMAL HIGH (ref 70–99)
Potassium: 4.5 mmol/L (ref 3.5–5.1)
Sodium: 141 mmol/L (ref 135–145)
Total Bilirubin: 1 mg/dL (ref 0.3–1.2)
Total Protein: 6.1 g/dL — ABNORMAL LOW (ref 6.5–8.1)

## 2019-08-13 LAB — TROPONIN I (HIGH SENSITIVITY)
Troponin I (High Sensitivity): 5 ng/L (ref <18)
Troponin I (High Sensitivity): 6 ng/L (ref <18)

## 2019-08-13 LAB — D-DIMER, QUANTITATIVE: D-Dimer, Quant: 0.29 ug/mL-FEU (ref 0.00–0.50)

## 2019-08-13 MED ORDER — ACETAMINOPHEN 325 MG PO TABS
650.0000 mg | ORAL_TABLET | Freq: Once | ORAL | Status: AC
  Administered 2019-08-13: 650 mg via ORAL
  Filled 2019-08-13: qty 2

## 2019-08-13 NOTE — ED Triage Notes (Signed)
Pt arrives from work via EMS with complaints of SOB and chest tightness which began upon waking at 0700 today. Tightness and SOB has continuously increased over course of morning.   Pt given 324 asa and 1 nitro with no relief.   144/84 HR 112 97% RA  18g LAC

## 2019-08-13 NOTE — Progress Notes (Signed)
Pt ambulatory to clinic by self. He reports "not feeling well." C/o L chest pain-tightness, L shoulder and upper arm pain-stiffness, HA, "feel like I'm struggling to catch my breath." Pt able to speak in full sentences. He does feel winded after walking to clinic which is not normally an issue for him. ShOB constant feeling but worse with exertion. Pt clammy, cool to touch. All sx started around 0600-0700 today and have progressively worsened.   BP elevated, worsened on recheck. Pt denies any personal cardiac HX, though he endorses heavy family Hx of MIs. Advised pt of my recommendation to proceed to ED for EKG, troponin/cardiac work up. He does not have anyone close by that he feels he can call to take him to ED. With worsening BP, recommended to pt that he allow EMS transport and he is agreeable to this.   #4 81mg  chewable ASA administered from clinic stock. EMS called at 1032.  Pt preferred to ambulate, refused wheelchair for escort to showroom main entrance with RN. Fire and EMS arrived.    FD vitals similar to last clinic vitals. EKG performed by EMS. No STEMI; Q wave present on 2nd ekg per medic.   Pt transported by stretcher to ambulance with plan to transport to [facility]. Pt already spoke with emergency contact, brother , and okayed RN to call and give him an update re: EKG and transport. Will follow pt with chart updates and call to check in when appropriate.

## 2019-08-13 NOTE — ED Provider Notes (Signed)
[facility] EMERGENCY DEPARTMENT Provider Note   CSN: [id number] Arrival date & time: 08/13/19  1133     History Chief Complaint  Patient presents with  . Shortness of Breath    Barry Zhang is a 54 y.o. male.  HPI 54 year old male with a history of HIV, depression presents to the ER for headache, shortness of breath and chest tightness which began today when he woke up at 7 AM.  Patient states that when he woke up, he has some difficulty getting full breaths in, states that he had to force himself to breathe.  Reports a chest heaviness radiating into his left arm.  Shortness of breath worsened with activity.  Patient said he went to work and had to do a lot of walking, progressively felt worse throughout the day.  Went to occupational health nurse, reports that his blood pressure was high and subsequent recheck showed an increase in blood pressure.  Patient states nurse reported that he looked clammy and cool to the touch.  He was given 324 aspirin and 1 nitro by EMS, unclear if this is helped him.  No chest pain at rest or with movement, but patient does endorse chest tightness.  Patient reports that he does not have any respiratory or cardiac issues, though he does have a history of MIs in his family.  He reports finishing a course of steroids a few days ago for a rash.  No history of Covid.  Patient reports that shortness of breath has improved now in the ED, though he has not really moved around in the last hour or so.  States he still has a frontal headache.  Patient denies cough, vision changes, rhinorrhea, fevers, sore throat, hemoptysis, diaphoresis, syncope, abdominal pain, back pain, IVDU.  Past Medical History:  Diagnosis Date  . HIV (human immunodeficiency virus infection) [facility])     Patient Active Problem List   Diagnosis Date Noted  . Libido, decreased 06/20/2017  . Allergic drug rash 07/25/2013  . Dysuria 07/14/2011  . Sore throat 07/14/2011  .  ALLERGIC CONJUNCTIVITIS 10/08/2008  . Depression 09/18/2008  . Human immunodeficiency virus (HIV) disease ([place]) 08/18/2007    No past surgical history on file.     Family History  Problem Relation Age of Onset  . Cancer Mother        ovarian  . Vascular Disease Father        carotid stenosis  . CAD Maternal Grandfather   . CAD Paternal Grandfather     Social History   Tobacco Use  . Smoking status: Former Smoker    Quit date: 07/14/1991    Years since quitting: 28.1  . Smokeless tobacco: Never Used  Substance Use Topics  . Alcohol use: No    Comment: rare wine  . Drug use: No    Home Medications Prior to Admission medications   Medication Sig Start Date End Date Taking? Authorizing Provider  cetirizine (ZYRTEC) 10 MG tablet Take 1 tablet (10 mg total) by mouth 2 (two) times daily as needed for up to 14 days for allergies. 07/31/19 08/14/19  Betancourt, Aura Fey, NP  Dolutegravir-lamiVUDine (DOVATO) 50-300 MG TABS Take 1 tablet by mouth daily. 02/09/19   Campbell Riches, MD  EPINEPHrine (EPIPEN) 0.3 mg/0.3 mL IJ SOAJ injection Inject 0.3 mLs (0.3 mg total) into the muscle as needed. Patient taking differently: Inject 0.3 mg into the muscle as needed for anaphylaxis (Allergic Reactions.).  08/09/17   Betancourt, Aura Fey,  NP  fluticasone (FLONASE) 50 MCG/ACT nasal spray Place 1 spray into both nostrils 2 (two) times daily as needed for allergies or rhinitis. Patient not taking: Reported on 07/31/2019 06/14/17 06/14/18  Barbaraann Barthel, NP  mineral oil-hydrophilic petrolatum (AQUAPHOR) ointment Apply topically as needed for dry skin. Patient taking differently: Apply 1 application topically as needed for dry skin.  07/31/19   Betancourt, Jarold Song, NP  predniSONE (STERAPRED UNI-PAK 21 TAB) 10 MG (21) TBPK tablet Taper take 6 pills by mouth days 1-2; 5 pills days 3-4; 4 pills days 5-6; 3 pills days 7-8; 2 pills days 9-10 and 1 pill days 11-12 with breakfast daily 07/31/19   Betancourt, Jarold Song, NP  sodium chloride (OCEAN) 0.65 % SOLN nasal spray Place 2 sprays into both nostrils every 2 (two) hours as needed for congestion.    [provider]  tadalafil (CIALIS) 5 MG tablet TAKE 1 TABLET BY MOUTH ONCE DAILY IF NEEDED Patient taking differently: Take 5 mg by mouth daily as needed for erectile dysfunction.  09/19/18   Ginnie Smart, MD    Allergies    Bee venom  Review of Systems   Review of Systems  Constitutional: Positive for fatigue. Negative for chills, diaphoresis and fever.  HENT: Negative for ear pain and sore throat.   Eyes: Negative for pain and visual disturbance.  Respiratory: Positive for shortness of breath. Negative for cough.   Cardiovascular: Positive for chest pain. Negative for palpitations and leg swelling.  Gastrointestinal: Negative for abdominal pain, nausea and vomiting.  Genitourinary: Negative for dysuria and hematuria.  Musculoskeletal: Negative for arthralgias and back pain.  Skin: Negative for color change and rash.  Neurological: Positive for weakness, light-headedness and headaches. Negative for dizziness, seizures, syncope and numbness.  Psychiatric/Behavioral: Negative for confusion.  All other systems reviewed and are negative.   Physical Exam Updated Vital Signs BP (!) 136/93   Pulse 91   Resp 20   SpO2 97%   Physical Exam Vitals and nursing note reviewed.  Constitutional:      General: He is not in acute distress.    Appearance: He is well-developed. He is not ill-appearing, toxic-appearing or diaphoretic.     Comments: Well-appearing, nondiaphoretic, speaking in full sentences.  HENT:     Head: Normocephalic and atraumatic.     Mouth/Throat:     Mouth: Mucous membranes are moist.     Pharynx: Oropharynx is clear.  Eyes:     Extraocular Movements: Extraocular movements intact.     Conjunctiva/sclera: Conjunctivae normal.     Pupils: Pupils are equal, round, and reactive to light.  Cardiovascular:     Rate and  Rhythm: Normal rate and regular rhythm.     Pulses: Normal pulses.     Heart sounds: Normal heart sounds. No murmur.  Pulmonary:     Effort: Pulmonary effort is normal. No tachypnea or respiratory distress.     Breath sounds: Normal breath sounds. No decreased breath sounds, wheezing or rhonchi.     Comments: Shallow breaths Abdominal:     Palpations: Abdomen is soft.     Tenderness: There is no abdominal tenderness.  Musculoskeletal:        General: Normal range of motion.     Cervical back: Neck supple.     Right lower leg: No edema.     Left lower leg: No edema.  Skin:    General: Skin is warm and dry.  Neurological:     Mental Status:  He is alert.     ED Results / Procedures / Treatments   Labs (all labs ordered are listed, but only abnormal results are displayed) Labs Reviewed  COMPREHENSIVE METABOLIC PANEL - Abnormal; Notable for the following components:      Result Value   Glucose, Bld 114 (*)    Creatinine, Ser 1.50 (*)    Total Protein 6.1 (*)    GFR calc non Af Amer 52 (*)    All other components within normal limits  CBC WITH DIFFERENTIAL/PLATELET - Abnormal; Notable for the following components:   Hemoglobin 17.4 (*)    Platelets 130 (*)    All other components within normal limits  URINALYSIS, ROUTINE W REFLEX MICROSCOPIC - Abnormal; Notable for the following components:   Color, Urine STRAW (*)    All other components within normal limits  BRAIN NATRIURETIC PEPTIDE  D-DIMER, QUANTITATIVE (NOT AT [facility])  TROPONIN I (HIGH SENSITIVITY)  TROPONIN I (HIGH SENSITIVITY)    EKG None  Radiology DG Chest Port 1 View  Result Date: 08/13/2019 CLINICAL DATA:  Shortness of breath and chest tightness since this morning. EXAM: PORTABLE CHEST 1 VIEW COMPARISON:  None. FINDINGS: Mildly enlarged cardiac silhouette. Mildly tortuous aorta. Minimally prominent interstitial markings. Mildly prominent pulmonary vasculature. No Kerley lines or pleural fluid seen. Thoracic  spine degenerative changes. IMPRESSION: Mild cardiomegaly, mild pulmonary vascular congestion and minimal chronic interstitial lung disease. Electronically Signed   By: Beckie Salts M.D.   On: 08/13/2019 12:07    Procedures Procedures (including critical care time)  Medications Ordered in ED Medications  acetaminophen (TYLENOL) tablet 650 mg (650 mg Oral Given 08/13/19 1438)    ED Course  I have reviewed the triage vital signs and the nursing notes.  Pertinent labs & imaging results that were available during my care of the patient were reviewed by me and considered in my medical decision making (see chart for details).    MDM Rules/Calculators/A&P                      54 year old male with a history of HIV presents with chest tightness, left arm pain, shortness of breath that started this morning. Presentation to the ED, patient is well-appearing, does not appear in distress, speaking in full sentences, nondiaphoretic.  Patient is mildly tachypneic, but other vital signs not concerning.  On physical exam, breath sounds short but lung sounds clear. No swelling of the legs bilaterally, no abdominal pain, back pain, chest wall pain not reproducible.   The emergent differential diagnosis for shortness of breath includes, but is not limited to, Pulmonary edema, bronchoconstriction, Pneumonia, Pulmonary embolism, Pneumotherax/ Hemothorax, Dysrythmia, ACS.  The emergent causes of chest pain include: Acute coronary syndrome, tamponade, pericarditis/myocarditis, aortic dissection, pulmonary embolism, tension pneumothorax, pneumonia, and esophageal rupture. Other urgent/non-acute considerations include, but are not limited to: chronic angina, aortic stenosis, cardiomyopathy, mitral valve prolapse, pulmonary hypertension, aortic insufficiency, right ventricular hypertrophy, pleuritis, bronchitis, pneumothorax, tumor, gastroesophageal reflux disease (GERD), esophageal spasm, Mallory-Weiss syndrome,  peptic ulcer disease, pancreatitis, functional gastrointestinal pain, cervical or thoracic disk disease or arthritis, shoulder arthritis, costochondritis, subacromial bursitis, anxiety or panic attack, herpes zoster, breast disorders, chest wall tumors, thoracic outlet syndrome, mediastinitis.  Patient hypertensive through ED course, states that he does not have a history of this but the last time he saw a doctor was elevated as well.  No signs of hypertensive urgency/emergency.  Reports being stressed at work.  Mildly tachycardic, possibly secondary to dehydration.  Respiratory rate normal, sats 97%.  Serial troponins negative.  Afebrile.  EKG sinus rhythm.  Chest x-ray with mild cardiomegaly and chronic minimal interstitial lung disease, but no acute cardiopulmonary process.  CBC without leukocytosis, normal hemoglobin.  CMP without electrolyte abnormalities, creatinine mildly elevated suspect secondary to dehydration.  Encourage patient to drink least 8 cups of water a day.  BNP normal.  UA without evidence of UTI.  D-dimer negative.  Patient received Tylenol in the ER, reports improvement in headache.  Given this work-up, suspicion for ACS/PE. Pt without complaints of back pain, not hypotensive, doubt dissection. Do not think CTA indicated at this time.  At this stage patient has been adequately screened for life-threatening causes of his chest pain/shortness of breath and he is stable for discharge.  Overall work-up reassuring, patient well-appearing at discharge.  Encourage close follow-up with PCP for hypertension.  Strict return precautions given.  Patient voices understanding and is agreeable to this plan.   Final Clinical Impression(s) / ED Diagnoses Final diagnoses:  SOB (shortness of breath)  Chest pain on breathing  Acute nonintractable headache, unspecified headache type    Rx / DC Orders ED Discharge Orders    None       Leone Brand 08/13/19 1550    Gerhard Munch, MD  08/20/19 [phone]

## 2019-08-13 NOTE — Telephone Encounter (Signed)
Notified patient with EMS transport to [facility] ED today for SOB/CP/not feeling well via telephone.  Reviewed ED notes/labs/CXR results.  Negative for STEMI.  Will follow up with patient tomorrow when in clinic/onsite.  Last seen by me 07/31/2019 for urticaria and started on prednisone taper.

## 2019-08-13 NOTE — Discharge Instructions (Addendum)
You were seen in the ER for chest tightness, shortness of breath.  Your work-up in the ER was negative for any heart attack, blood clot in your lungs, or any other life-threatening causes of chest pain shortness of breath.  Please follow-up with your primary care provider additional cardiac test may be needed to work-up any chronic heart issues.  Return to the ER if your symptoms worsen.

## 2019-08-14 ENCOUNTER — Ambulatory Visit: Payer: PRIVATE HEALTH INSURANCE | Attending: Internal Medicine

## 2019-08-14 DIAGNOSIS — Z23 Encounter for immunization: Secondary | ICD-10-CM

## 2019-08-14 NOTE — Progress Notes (Signed)
   Covid-19 Vaccination Clinic  Name:  Barry Zhang    MRN: [id number] DOB: Sep 09, 1965  08/14/2019  [name] was observed post Covid-19 immunization for 15 minutes without incident. He was provided with Vaccine Information Sheet and instruction to access the V-Safe system.   [name] was instructed to call 911 with any severe reactions post vaccine: Marland Kitchen Difficulty breathing  . Swelling of face and throat  . A fast heartbeat  . A bad rash all over body  . Dizziness and weakness   Immunizations Administered    Name Date Dose VIS Date Route   Pfizer COVID-19 Vaccine 08/14/2019  1:35 PM 0.3 mL 06/20/2018 Intramuscular   Manufacturer: [employer], [employer]   Lot: [id number]   NDC: [address]-0

## 2019-08-15 ENCOUNTER — Telehealth: Payer: Self-pay

## 2019-08-15 NOTE — Telephone Encounter (Signed)
Telephone message left for patient checking in to see if symptoms improved or worsening and if any further questions or concerns.  Clinic closed today but can be reached tomorrow in clinic normal hours.  My chart message or [email or web address] also.  Email sent to patient yesterday also to follow up to work email.

## 2019-08-15 NOTE — Telephone Encounter (Signed)
Patient called office to update MD about recent "heart attack" episode. States that MD encouraged patient to follow up with PCP regarding his visit to the ED. EKG and labs were clear. Advised patient he MUST establish primary care for visits like this. Patient verbalized agreement; will start looking for a local provider. Would like to know if MD would like to him for this.  Will forward message to Md Lorenso Courier, [place]

## 2019-08-16 NOTE — Telephone Encounter (Signed)
Per MD okay to schedule follow up appointment. Must encourage to establish care with PCP. Left voicemail with patient requesting a call back Lorenso Courier, CMA

## 2019-08-16 NOTE — Telephone Encounter (Signed)
Noted reviewed RN Haley note 

## 2019-08-16 NOTE — Telephone Encounter (Signed)
Per Md okay to schedule follow up with office due to recent hospital visit. MD is encouraging patient to establish PCP. Lorenso Courier, [place]

## 2019-08-16 NOTE — Telephone Encounter (Signed)
Attempted to speak with pt by phone today to f/u after ED visit 08/13/19. No answer, LVMRCB.  Noted that pt called Inf Dis MD office to inform them of ED visit and they advised establishing pcp care. Per chart review, pt agreeable to this and will start looking for pcp. Can provide list of local practices accepting new pts.

## 2019-08-29 NOTE — Telephone Encounter (Signed)
Reviewed Epic no PCM notes or follow up noted with RN Rolly Salter at [facility].  Recommend repeat BP and labs this month to follow up low platelets and decreased kidney function.  Be Well labs due Jun 2021.  Discussed patient could have Be Well drawn this month to take with him to [facility] 2 visit or have after PCM visit.  Message also stated he could mychart me, call us tomorrow at extension [phone] when clinic reopens or email me at [email or web address]

## 2019-08-31 NOTE — Telephone Encounter (Signed)
Spoke with pt by phone. He reports feeling well. No c/o currently. He is working with Inf Dis to get set up with primary care. Scheduled pt for fasting labs in clinic on 09/03/19.

## 2019-09-01 NOTE — Telephone Encounter (Signed)
Noted PCM establishment pending and follow up labs scheduled.  Patient feeling well denied symptoms s/p ER visit

## 2019-09-03 ENCOUNTER — Other Ambulatory Visit: Payer: Self-pay

## 2019-09-03 ENCOUNTER — Ambulatory Visit: Payer: Self-pay | Admitting: *Deleted

## 2019-09-03 VITALS — BP 134/84 | HR 77

## 2019-09-03 DIAGNOSIS — Z0000 Encounter for general adult medical examination without abnormal findings: Secondary | ICD-10-CM

## 2019-09-03 DIAGNOSIS — R899 Unspecified abnormal finding in specimens from other organs, systems and tissues: Secondary | ICD-10-CM

## 2019-09-03 NOTE — Progress Notes (Signed)
BP much improved today compared to ED 08/13/19.  Pt reports appt in June to establish care with a new pcp. Does not recall name and no upcoming appts noted in [facility].  Pt does report that Dr. Ninetta Lights told him he is not concerned with the elevated creatinine stating that his Dovato medication will cause fluctuations in his levels and he has been around 1.50 previously.

## 2019-09-04 LAB — CMP12+LP+TP+TSH+6AC+PSA+CBC…
ALT: 29 IU/L (ref 0–44)
AST: 18 IU/L (ref 0–40)
Albumin/Globulin Ratio: 1.9 (ref 1.2–2.2)
Albumin: 4 g/dL (ref 3.8–4.9)
Alkaline Phosphatase: 80 IU/L (ref 39–117)
BUN/Creatinine Ratio: 12 (ref 9–20)
BUN: 15 mg/dL (ref 6–24)
Basophils Absolute: 0 10*3/uL (ref 0.0–0.2)
Basos: 1 %
Bilirubin Total: 0.5 mg/dL (ref 0.0–1.2)
Calcium: 8.7 mg/dL (ref 8.7–10.2)
Chloride: 107 mmol/L — ABNORMAL HIGH (ref 96–106)
Chol/HDL Ratio: 4.4 ratio (ref 0.0–5.0)
Cholesterol, Total: 197 mg/dL (ref 100–199)
Creatinine, Ser: 1.24 mg/dL (ref 0.76–1.27)
EOS (ABSOLUTE): 0.2 10*3/uL (ref 0.0–0.4)
Eos: 4 %
Estimated CHD Risk: 0.9 times avg. (ref 0.0–1.0)
Free Thyroxine Index: 1.9 (ref 1.2–4.9)
GFR calc Af Amer: 76 mL/min/{1.73_m2} (ref >59)
GFR calc non Af Amer: 66 mL/min/{1.73_m2} (ref >59)
GGT: 10 IU/L (ref 0–65)
Globulin, Total: 2.1 g/dL (ref 1.5–4.5)
Glucose: 95 mg/dL (ref 65–99)
HDL: 45 mg/dL (ref >39)
Hematocrit: 46.1 % (ref 37.5–51.0)
Hemoglobin: 16.1 g/dL (ref 13.0–17.7)
Immature Grans (Abs): 0 10*3/uL (ref 0.0–0.1)
Immature Granulocytes: 0 %
Iron: 105 ug/dL (ref 38–169)
LDH: 247 IU/L — ABNORMAL HIGH (ref 121–224)
LDL Chol Calc (NIH): 132 mg/dL — ABNORMAL HIGH (ref 0–99)
Lymphocytes Absolute: 1.2 10*3/uL (ref 0.7–3.1)
Lymphs: 29 %
MCH: 32.6 pg (ref 26.6–33.0)
MCHC: 34.9 g/dL (ref 31.5–35.7)
MCV: 93 fL (ref 79–97)
Monocytes Absolute: 0.5 10*3/uL (ref 0.1–0.9)
Monocytes: 13 %
Neutrophils Absolute: 2.1 10*3/uL (ref 1.4–7.0)
Neutrophils: 53 %
Phosphorus: 3.2 mg/dL (ref 2.8–4.1)
Platelets: 149 10*3/uL — ABNORMAL LOW (ref 150–450)
Potassium: 4.5 mmol/L (ref 3.5–5.2)
Prostate Specific Ag, Serum: 0.8 ng/mL (ref 0.0–4.0)
RBC: 4.94 x10E6/uL (ref 4.14–5.80)
RDW: 12 % (ref 11.6–15.4)
Sodium: 143 mmol/L (ref 134–144)
T3 Uptake Ratio: 27 % (ref 24–39)
T4, Total: 7.1 ug/dL (ref 4.5–12.0)
TSH: 1.16 u[IU]/mL (ref 0.450–4.500)
Total Protein: 6.1 g/dL (ref 6.0–8.5)
Triglycerides: 109 mg/dL (ref 0–149)
Uric Acid: 6.9 mg/dL (ref 3.8–8.4)
VLDL Cholesterol Cal: 20 mg/dL (ref 5–40)
WBC: 4 10*3/uL (ref 3.4–10.8)

## 2019-09-04 LAB — HGB A1C W/O EAG: Hgb A1c MFr Bld: 5.4 % (ref 4.8–5.6)

## 2019-09-04 NOTE — Progress Notes (Signed)
Results reviewed with pt in clinic. Kidney function improved back to WNL. LDH slightly elevated with no Hx of same. Liver function panel WNL. LDL worsened from last year. Hgb returned to WNL. Platelets slightly decreased but improved. Diet and exercise recommendations discussed re: LDL, wt management. Routine f/u with pcp. Working to establish care with new pcp. Appt pending. Copy of labs provided to pt. Results routed to [facility] per pt request. No further questions/concerns.

## 2019-09-11 NOTE — Telephone Encounter (Signed)
Labs completed and patient notified of results by RN Rolly Salter last week.  See results note in Epic.  Patient denied symptoms and pending follow up with new PCM.

## 2019-11-01 ENCOUNTER — Encounter: Payer: Self-pay | Admitting: *Deleted

## 2019-11-01 NOTE — Progress Notes (Signed)
Pt establishing priamry care as a new patient with Dr Renford Dills at [facility]. Pt requested to have 2021 labs and ER visit notes sent to provider.  Records release obtained and added to Document List.  Emailed to pcp office due to multiple fax issues/non-received faxes with this office in the past. Email confirmed by phone with staff of [email or web address].

## 2019-11-30 ENCOUNTER — Telehealth: Payer: Self-pay | Admitting: *Deleted

## 2019-11-30 NOTE — Telephone Encounter (Signed)
Noted agree with plan of care per CDC guidelines.  Patient should be wearing mask when indoors with family or out in community, encourage frequent hand washing and review covid red flag symptoms with patient also

## 2019-11-30 NOTE — Telephone Encounter (Signed)
Pt is fully vaccinated for Covid-19. Pt reported his brother was experiencing Covid like symptoms on 11/26/19 and they were together on 11/25/19. Pt placed on quarantine pending brothers test result. Pt began experiencing sx on 11/27/19. Sore throat, fatigue, nasal congestion. Brothers test came back positive that day as well. Pt scheduled for testing 11/29/19. Results not back as of 11/30/19 at 1500. Sx remain same. Pt taking Dayquil and Nyquil. Ketotifen, lubricating eye drops and moist compresses with eye drainage that occurs in the morning. Eyes only itchy, not swollen, painful.  Plan for 7 day quarantine due to known exposure while vaccinated. If his test returns positive will extend to 10 days. Plan to f/u by phone Monday 8/9. Emergent sx reviewed.

## 2019-12-03 ENCOUNTER — Encounter: Payer: Self-pay | Admitting: *Deleted

## 2019-12-03 MED ORDER — FLUTICASONE PROPIONATE 50 MCG/ACT NA SUSP: 1.0000 | Freq: Two times a day (BID) | NASAL | 6 refills | Status: DC | PRN

## 2019-12-03 NOTE — Telephone Encounter (Signed)
Noted negative covid test symptoms improving fully vaccinated may return to work after 7 days quarantine per [employer] and wearing mask indoors.

## 2019-12-03 NOTE — Telephone Encounter (Signed)
Spoke with pt over phone. Reports only mild sore throat and PND and mild nasal congestion now. Using OTCs, saline, Flonase, Dayquil. Pt reports negative covid test result. Due to fully vaccinated status with improving sx, no fever/n/v/d/chills, pt approved to RTW tomorrow 8/10 after >7 day quarantine. HR notified. Pt verbalizes understanding and agreement with plan of care.

## 2019-12-31 ENCOUNTER — Other Ambulatory Visit: Payer: Self-pay | Admitting: Infectious Diseases

## 2019-12-31 DIAGNOSIS — R6882 Decreased libido: Secondary | ICD-10-CM

## 2020-01-02 ENCOUNTER — Telehealth: Payer: Self-pay

## 2020-01-02 NOTE — Telephone Encounter (Signed)
Received voicemail today from patient stating his medication was denied and does not know why. Patient has not been seen in clinic since 10/2018. Patient is overdue for appointment. Refills on hold until patient is seen in office.  Left voicemail requesting patient call office back to schedule overdue lab and follow up. Barry Zhang, [place]

## 2020-01-03 ENCOUNTER — Ambulatory Visit: Payer: Self-pay | Admitting: Registered Nurse

## 2020-01-03 ENCOUNTER — Other Ambulatory Visit: Payer: Self-pay

## 2020-01-03 ENCOUNTER — Encounter: Payer: Self-pay | Admitting: Registered Nurse

## 2020-01-03 VITALS — BP 122/88 | HR 88 | Temp 97.9°F

## 2020-01-03 DIAGNOSIS — M26609 Unspecified temporomandibular joint disorder, unspecified side: Secondary | ICD-10-CM

## 2020-01-03 DIAGNOSIS — L247 Irritant contact dermatitis due to plants, except food: Secondary | ICD-10-CM

## 2020-01-03 DIAGNOSIS — L03116 Cellulitis of left lower limb: Secondary | ICD-10-CM

## 2020-01-03 MED ORDER — AQUAPHOR EX OINT: TOPICAL_OINTMENT | CUTANEOUS | 0 refills | Status: DC | PRN

## 2020-01-03 MED ORDER — HYDROCORTISONE 1 % EX LOTN: 1.0000 "application " | TOPICAL_LOTION | Freq: Two times a day (BID) | CUTANEOUS | 0 refills | Status: AC

## 2020-01-03 MED ORDER — DIPHENHYDRAMINE HCL 2 % EX GEL: 1.0000 "application " | Freq: Two times a day (BID) | CUTANEOUS | Status: AC | PRN

## 2020-01-03 MED ORDER — BACITRACIN-NEOMYCIN-POLYMYXIN 400-5-5000 EX OINT: 1.0000 "application " | TOPICAL_OINTMENT | Freq: Two times a day (BID) | CUTANEOUS | 0 refills | Status: AC

## 2020-01-03 MED ORDER — AMOXICILLIN 875 MG PO TABS: 875.0000 mg | ORAL_TABLET | Freq: Two times a day (BID) | ORAL | 0 refills | Status: AC

## 2020-01-03 NOTE — Patient Instructions (Signed)
Jaw Range of Motion Exercises Jaw range of motion exercises are exercises that help your jaw move better. Exercises that help you have good posture (postural exercises) also help relieve jaw discomfort. These are often done along with range of motion exercises. These exercises can help prevent or improve: Difficulty opening your mouth.  Pain in your jaw while it is open or closed.  Temporomandibular joint (TMJ) pain.  Headache caused by jaw tension. Take other actions to prevent or relieve jaw pain, such as:  Avoiding things that cause or increase jaw pain. This may include: ? Chewing gum or eating hard foods. ? Clenching your jaw or teeth, grinding your teeth, or keeping tension in your jaw muscles. ? Opening your mouth wide, such as for a big yawn. ? Leaning on your jaw, such as resting your jaw in your hand while leaning on a desk.  Putting ice on your jaw. ? Put ice in a plastic bag. ? Place a towel between your skin and the bag. ? Leave the ice on for 10-15 minutes, 2-3 times a day. Only do jaw exercises that your health care provider approves of. Only move your jaw as far as it can comfortably go in each direction. Do not move your jaw into positions that cause pain. Range of motion exercises Repeat each of these exercises 8 times, 1-2 times a day, or as told by your health care provider. Exercise A: Forward protrusion 1. Push your jaw forward. Hold this position for 1-2 seconds. 2. Allow your jaw to return to its normal position and rest it there for 1-2 seconds. Exercise B: Controlled opening 1. Stand or sit in front of a mirror. Place your tongue on the roof of your mouth, just behind your top teeth. 2. Keeping your tongue on the roof of your mouth, slowly open and close your mouth. 3. While you open and close your mouth, watch your jaw in the mirror. Try to keep your jaw from moving to one side or the other. Exercise C: Right and left motion 1. Move your jaw right. Hold this  position for 1-2 seconds. Allow your jaw to return to its normal position, and rest it there for 1-2 seconds. 2. Move your jaw left. Hold this position for 1-2 seconds. Allow your jaw to return to its normal position, and rest it there for 1-2 seconds. Postural exercises Exercise A: Chin tucks 1. You can do this exercise sitting, standing, or lying down. 2. Move your head straight back, keeping your head level. You can guide the movement by placing your fingers on your chin to push your jaw back in an even motion. You should be able to feel a double chin form at the end of the motion. 3. Hold this position for 5 seconds. Repeat 10-15 times. Exercise B: Shoulder blade squeeze 1. Sit or stand. 2. Bend your elbows to about 90 degrees, which is the shape of a capital letter "L." Keep your upper arms by your body. 3. Squeeze your shoulder blades down and back, as though you were trying to touch your elbows behind you. Do not shrug your shoulders or move your head. 4. Hold this position for 5 seconds. Repeat 10-15 times. Exercise C: Chest stretch 1. Stand facing a corner. 2. Put both of your hands and your forearms on the wall, with your arms wide apart. 3. Make sure your arms are at a 90-degree angle to your body. This means that you should hold your arms straight out  from your body, level with the floor. 4. Step in toward the corner. Do not lean in. 5. Hold this position for 30 seconds. Repeat 3 times. Contact a health care provider if you have:  Jaw pain that is new or gets worse.  Clicking or popping sounds while doing the exercises. Get help right away if:  Your jaw is stuck in one place and you cannot move it.  You cannot open or close your mouth. This information is not intended to replace advice given to you by your health care provider. Make sure you discuss any questions you have with your health care provider. Document Revised: 08/04/2018 Document Reviewed: 03/09/2017 Elsevier  Patient Education  2020 [employer]  Cervical Strain and Sprain Rehab Ask your health care provider which exercises are safe for you. Do exercises exactly as told by your health care provider and adjust them as directed. It is normal to feel mild stretching, pulling, tightness, or discomfort as you do these exercises. Stop right away if you feel sudden pain or your pain gets worse. Do not begin these exercises until told by your health care provider. Stretching and range-of-motion exercises Cervical side bending  1. Using good posture, sit on a stable chair or stand up. 2. Without moving your shoulders, slowly tilt your left / right ear to your shoulder until you feel a stretch in the opposite side neck muscles. You should be looking straight ahead. 3. Hold for _____15_____ seconds. 4. Repeat with the other side of your neck. Repeat ___3______ times. Complete this exercise __3________ times a day. Cervical rotation  1. Using good posture, sit on a stable chair or stand up. 2. Slowly turn your head to the side as if you are looking over your left / right shoulder. ? Keep your eyes level with the ground. ? Stop when you feel a stretch along the side and the back of your neck. 3. Hold for _____15_____ seconds. 4. Repeat this by turning to your other side. Repeat __3______ times. Complete this exercise ___3_______ times a day. Thoracic extension and pectoral stretch 1. Roll a towel or a small blanket so it is about 4 inches (10 cm) in diameter. 2. Lie down on your back on a firm surface. 3. Put the towel lengthwise, under your spine in the middle of your back. It should not be under your shoulder blades. The towel should line up with your spine from your middle back to your lower back. 4. Put your hands behind your head and let your elbows fall out to your sides. 5. Hold for ____15______ seconds. Repeat ___3_______ times. Complete this exercise _____3_____ times a day. Strengthening  exercises Isometric upper cervical flexion 1. Lie on your back with a thin pillow behind your head and a small rolled-up towel under your neck. 2. Gently tuck your chin toward your chest and nod your head down to look toward your feet. Do not lift your head off the pillow. 3. Hold for _____15_____ seconds. 4. Release the tension slowly. Relax your neck muscles completely before you repeat this exercise. Repeat ___3_______ times. Complete this exercise ___3_______ times a day. Isometric cervical extension  1. Stand about 6 inches (15 cm) away from a wall, with your back facing the wall. 2. Place a soft object, about 6-8 inches (15-20 cm) in diameter, between the back of your head and the wall. A soft object could be a small pillow, a ball, or a folded towel. 3. Gently tilt your head back  and press into the soft object. Keep your jaw and forehead relaxed. 4. Hold for ____15_____ seconds. 5. Release the tension slowly. Relax your neck muscles completely before you repeat this exercise. Repeat ____3______ times. Complete this exercise _____3_____ times a day. Posture and body mechanics Body mechanics refers to the movements and positions of your body while you do your daily activities. Posture is part of body mechanics. Good posture and healthy body mechanics can help to relieve stress in your body's tissues and joints. Good posture means that your spine is in its natural S-curve position (your spine is neutral), your shoulders are pulled back slightly, and your head is not tipped forward. The following are general guidelines for applying improved posture and body mechanics to your everyday activities. Sitting  1. When sitting, keep your spine neutral and keep your feet flat on the floor. Use a footrest, if necessary, and keep your thighs parallel to the floor. Avoid rounding your shoulders, and avoid tilting your head forward. 2. When working at a desk or a computer, keep your desk at a height where  your hands are slightly lower than your elbows. Slide your chair under your desk so you are close enough to maintain good posture. 3. When working at a computer, place your monitor at a height where you are looking straight ahead and you do not have to tilt your head forward or downward to look at the screen. Standing   When standing, keep your spine neutral and keep your feet about hip-width apart. Keep a slight bend in your knees. Your ears, shoulders, and hips should line up.  When you do a task in which you stand in one place for a long time, place one foot up on a stable object that is 2-4 inches (5-10 cm) high, such as a footstool. This helps keep your spine neutral. Resting When lying down and resting, avoid positions that are most painful for you. Try to support your neck in a neutral position. You can use a contour pillow or a small rolled-up towel. Your pillow should support your neck but not push on it. This information is not intended to replace advice given to you by your health care provider. Make sure you discuss any questions you have with your health care provider. Document Revised: 08/02/2018 Document Reviewed: 01/11/2018 Elsevier Patient Education  2020 Elsevier Inc.  Cellulitis, Adult  Cellulitis is a skin infection. The infected area is usually warm, red, swollen, and tender. This condition occurs most often in the arms and lower legs. The infection can travel to the muscles, blood, and underlying tissue and become serious. It is very important to get treated for this condition. What are the causes? Cellulitis is caused by bacteria. The bacteria enter through a break in the skin, such as a cut, burn, insect bite, open sore, or crack. What increases the risk? This condition is more likely to occur in people who:  Have a weak body defense system (immune system).  Have open wounds on the skin, such as cuts, burns, bites, and scrapes. Bacteria can enter the body through  these open wounds.  Are older than 54 years of age.  Have diabetes.  Have a type of long-lasting (chronic) liver disease (cirrhosis) or kidney disease.  Are obese.  Have a skin condition such as: ? Itchy rash (eczema). ? Slow movement of blood in the veins (venous stasis). ? Fluid buildup below the skin (edema).  Have had radiation therapy.  Use IV drugs.  What are the signs or symptoms? Symptoms of this condition include:  Redness, streaking, or spotting on the skin.  Swollen area of the skin.  Tenderness or pain when an area of the skin is touched.  Warm skin.  A fever.  Chills.  Blisters. How is this diagnosed? This condition is diagnosed based on a medical history and physical exam. You may also have tests, including:  Blood tests.  Imaging tests. How is this treated? Treatment for this condition may include:  Medicines, such as antibiotic medicines or medicines to treat allergies (antihistamines).  Supportive care, such as rest and application of cold or warm cloths (compresses) to the skin.  Hospital care, if the condition is severe. The infection usually starts to get better within 1-2 days of treatment. Follow these instructions at home:  Medicines  Take over-the-counter and prescription medicines only as told by your health care provider.  If you were prescribed an antibiotic medicine, take it as told by your health care provider. Do not stop taking the antibiotic even if you start to feel better. General instructions  Drink enough fluid to keep your urine pale yellow.  Do not touch or rub the infected area.  Raise (elevate) the infected area above the level of your heart while you are sitting or lying down.  Apply warm or cold compresses to the affected area as told by your health care provider.  Keep all follow-up visits as told by your health care provider. This is important. These visits let your health care provider make sure a more  serious infection is not developing. Contact a health care provider if:  You have a fever.  Your symptoms do not begin to improve within 1-2 days of starting treatment.  Your bone or joint underneath the infected area becomes painful after the skin has healed.  Your infection returns in the same area or another area.  You notice a swollen bump in the infected area.  You develop new symptoms.  You have a general ill feeling (malaise) with muscle aches and pains. Get help right away if:  Your symptoms get worse.  You feel very sleepy.  You develop vomiting or diarrhea that persists.  You notice red streaks coming from the infected area.  Your red area gets larger or turns dark in color. These symptoms may represent a serious problem that is an emergency. Do not wait to see if the symptoms will go away. Get medical help right away. Call your local emergency services (911 in the [place] Do not drive yourself to the hospital. Summary  Cellulitis is a skin infection. This condition occurs most often in the arms and lower legs.  Treatment for this condition may include medicines, such as antibiotic medicines or antihistamines.  Take over-the-counter and prescription medicines only as told by your health care provider. If you were prescribed an antibiotic medicine, do not stop taking the antibiotic even if you start to feel better.  Contact a health care provider if your symptoms do not begin to improve within 1-2 days of starting treatment or your symptoms get worse.  Keep all follow-up visits as told by your health care provider. This is important. These visits let your health care provider make sure that a more serious infection is not developing. This information is not intended to replace advice given to you by your health care provider. Make sure you discuss any questions you have with your health care provider. Document Revised: 09/01/2017 Document Reviewed: 09/01/2017 Elsevier  Patient Education  [employer]. Poison Ivy Dermatitis Poison ivy dermatitis is inflammation of the skin that is caused by chemicals in the leaves of the poison ivy plant. The skin reaction often involves redness, swelling, blisters, and extreme itching. What are the causes? This condition is caused by a chemical (urushiol) found in the sap of the poison ivy plant. This chemical is sticky and can be easily spread to people, animals, and objects. You can get poison ivy dermatitis by:  Having direct contact with a poison ivy plant.  Touching animals, other people, or objects that have come in contact with poison ivy and have the chemical on them. What increases the risk? This condition is more likely to develop in people who:  Are outdoors often in wooded or [place] areas.  Go outdoors without wearing protective clothing, such as closed shoes, long pants, and a long-sleeved shirt. What are the signs or symptoms? Symptoms of this condition include:  Redness of the skin.  Extreme itching.  A rash that often includes bumps and blisters. The rash usually appears 48 hours after exposure, if you have been exposed before. If this is the first time you have been exposed, the rash may not appear until a week after exposure.  Swelling. This may occur if the reaction is more severe. Symptoms usually last for 1-2 weeks. However, the first time you develop this condition, symptoms may last 3-4 weeks. How is this diagnosed? This condition may be diagnosed based on your symptoms and a physical exam. Your health care provider may also ask you about any recent outdoor activity. How is this treated? Treatment for this condition will vary depending on how severe it is. Treatment may include:  Hydrocortisone cream or calamine lotion to relieve itching.  Oatmeal baths to soothe the skin.  Medicines, such as over-the-counter antihistamine tablets.  Oral steroid medicine, for more severe  reactions. Follow these instructions at home: Medicines  Take or apply over-the-counter and prescription medicines only as told by your health care provider.  Use hydrocortisone cream or calamine lotion as needed to soothe the skin and relieve itching. General instructions  Do not scratch or rub your skin.  Apply a cold, wet cloth (cold compress) to the affected areas or take baths in cool water. This will help with itching. Avoid hot baths and showers.  Take oatmeal baths as needed. Use colloidal oatmeal. You can get this at your local pharmacy or grocery store. Follow the instructions on the packaging.  While you have the rash, wash clothes right after you wear them.  Keep all follow-up visits as told by your health care provider. This is important. How is this prevented?   Learn to identify the poison ivy plant and avoid contact with the plant. This plant can be recognized by the number of leaves. Generally, poison ivy has three leaves with flowering branches on a single stem. The leaves are typically glossy, and they have jagged edges that come to a point at the front.  If you have been exposed to poison ivy, thoroughly wash with soap and water right away. You have about 30 minutes to remove the plant resin before it will cause the rash. Be sure to wash under your fingernails, because any plant resin there will continue to spread the rash.  When hiking or camping, wear clothes that will help you to avoid exposure on the skin. This includes long pants, a long-sleeved shirt, tall socks, and hiking boots. You can also apply  preventive lotion to your skin to help limit exposure.  If you suspect that your clothes or outdoor gear came in contact with poison ivy, rinse them off outside with a garden hose before you bring them inside your house.  When doing yard work or gardening, wear gloves, long sleeves, long pants, and boots. Wash your garden tools and gloves if they come in contact with  poison ivy.  If you suspect that your pet has come into contact with poison ivy, wash him or her with pet shampoo and water. Make sure to wear gloves while washing your pet. Contact a health care provider if you have:  Open sores in the rash area.  More redness, swelling, or pain in the affected area.  Redness that spreads beyond the rash area.  Fluid, blood, or pus coming from the affected area.  A fever.  A rash over a large area of your body.  A rash on your eyes, mouth, or genitals.  A rash that does not improve after a few weeks. Get help right away if:  Your face swells or your eyes swell shut.  You have trouble breathing.  You have trouble swallowing. These symptoms may represent a serious problem that is an emergency. Do not wait to see if the symptoms will go away. Get medical help right away. Call your local emergency services (911 in the [place] Do not drive yourself to the hospital. Summary  Poison ivy dermatitis is inflammation of the skin that is caused by chemicals in the leaves of the poison ivy plant.  Symptoms of this condition include redness, itching, a rash, and swelling.  Do not scratch or rub your skin.  Take or apply over-the-counter and prescription medicines only as told by your health care provider. This information is not intended to replace advice given to you by your health care provider. Make sure you discuss any questions you have with your health care provider. Document Revised: 08/04/2018 Document Reviewed: 04/07/2018 Elsevier Patient Education  2020 [employer].

## 2020-01-03 NOTE — Progress Notes (Signed)
Subjective:    Patient ID: Barry Zhang, male    DOB: 01-18-1966, 54 y.o.   MRN: [id number]  54y/o Caucasian male established patient presenting for c/o of not completely close jaw (gap between teeth new) and questions regarding syphilis testing. Jaw issue present since yesterday, L side at TMJ. Feels he can open mouth fully but when he tries to close, feels he can't completely, teeth do not line up correctly. Only painful when trying to close jaw all the way. No home treatment.  Hasn't seen dentist since start of covid pandemic.  His dentist retired and he has not found new provider that is accepting new patients yet.  Denied having trauma, teeth grinding, chewing a lot of gum or meals that required a lot of chewing in the past couple of days.  Denied tooth pain/untreated dental problems.  Reports intimate contact but no sexual intercourse with a male approx 10-12 weeks ago and was informed this week by that person that they had developed a upper body rash and was being treated for syphilis. Pt inquiring about testing. He denies any genital ulcers, pain, or body rashes. RN advised pt that per Up To Date, RPR testing should be performed and repeated In 2-4 weeks if negative if there is high clinical suspicion, or patient is considered high risk. Presumptive treatment is appropriate in high risk patients. Full STD panel testing is also recommended. Clinic contract prohibits STD testing and treatment so pt referred to his PCP or ID MD. Pt sts he will call pcp office for scheduling appt as ID office has higher copay.   Patient has rash right arm from poison ivy.  Applying calagel topical prn.  Not spreading but itchy.  Denied purulent drainage, enlarged lymph nodes, fever, or chills.  Patient with rash leg left lower unsure if bug bite or just rash from poison ivy that he scratched but pain with weight bearing now.  "I feel it pulsing when I walk"  "I think it is infected"  RN Haley cleansed area with  first aid spray and patient applied triple antibiotic and bandaid earlier today.  He has noticed a little discharge from pimple.     Review of Systems  Constitutional: Negative for activity change, appetite change, chills, diaphoresis, fatigue and fever.  HENT: Negative for trouble swallowing and voice change.   Eyes: Negative for photophobia and visual disturbance.  Respiratory: Negative for cough, shortness of breath, wheezing and stridor.   Cardiovascular: Negative for chest pain and palpitations.  Gastrointestinal: Negative for diarrhea, nausea and vomiting.  Endocrine: Negative for cold intolerance and heat intolerance.  Genitourinary: Negative for difficulty urinating.  Musculoskeletal: Negative for arthralgias and myalgias.  Skin: Positive for color change and rash. Negative for pallor.  Allergic/Immunologic: Positive for environmental allergies and immunocompromised state. Negative for food allergies.  Neurological: Negative for tremors, syncope, weakness and headaches.  Hematological: Negative for adenopathy. Does not bruise/bleed easily.  Psychiatric/Behavioral: Negative for agitation, confusion and sleep disturbance.       Objective:   Physical Exam Vitals and nursing note reviewed.  Constitutional:      General: He is awake. He is not in acute distress.    Appearance: Normal appearance. He is well-developed, well-groomed and normal weight. He is not ill-appearing, toxic-appearing or diaphoretic.  HENT:     Head: Normocephalic and atraumatic.     Jaw: Tenderness, pain on movement and malocclusion present. No swelling.     Salivary Glands: Right salivary gland is not  diffusely enlarged or tender. Left salivary gland is not diffusely enlarged or tender.      Comments: L TMJ mildly TTP when jaw open; patient reports teeth are not touching when he is fully trying to close jaw; denied popping/locking with AROM; spoke full sentences without difficulty; malocclusion not  noticeable visually with lips closed jaw appears aligned bilaterally and no edema/erythema/increased temperature bilateral TMJs; normal malampatti/can visualize oropharyngeal/uvula/posterior pharynx without difficulty; bilateral TMs air fluid level clear    Right Ear: Hearing, tympanic membrane, ear canal and external ear normal.     Left Ear: Hearing, tympanic membrane, ear canal and external ear normal.     Nose: Nose normal. No congestion or rhinorrhea.     Right Sinus: No maxillary sinus tenderness or frontal sinus tenderness.     Left Sinus: No maxillary sinus tenderness or frontal sinus tenderness.     Mouth/Throat:     Lips: Pink. No lesions.     Mouth: Mucous membranes are moist. No lacerations, oral lesions or angioedema.     Dentition: No gingival swelling, dental abscesses or gum lesions.     Tongue: No lesions. Tongue does not deviate from midline.     Palate: No mass and lesions.     Pharynx: Oropharynx is clear. Uvula midline. No oropharyngeal exudate or uvula swelling.     Tonsils: No tonsillar exudate or tonsillar abscesses.  Eyes:     General: Lids are normal. Vision grossly intact. Gaze aligned appropriately. No allergic shiner, visual field deficit or scleral icterus.       Right eye: No discharge.        Left eye: No discharge.     Extraocular Movements: Extraocular movements intact.     Conjunctiva/sclera: Conjunctivae normal.     Pupils: Pupils are equal, round, and reactive to light.  Neck:     Trachea: Trachea and phonation normal. No tracheal tenderness or tracheal deviation.  Cardiovascular:     Rate and Rhythm: Normal rate and regular rhythm.     Pulses: Normal pulses.          Radial pulses are 2+ on the right side and 2+ on the left side.  Pulmonary:     Effort: Pulmonary effort is normal.     Breath sounds: Normal breath sounds and air entry. No stridor, decreased air movement or transmitted upper airway sounds. No wheezing.     Comments: Wearing mask due  to covid 19 pandemic; spoke full sentences without difficulty; no cough observed in clinic Abdominal:     General: Abdomen is flat.     Palpations: Abdomen is soft.  Musculoskeletal:        General: Swelling and tenderness present. No deformity. Normal range of motion.     Right shoulder: No swelling, deformity or laceration. Normal range of motion.     Left shoulder: No swelling, deformity or laceration. Normal range of motion.     Right elbow: No swelling, deformity or lacerations. Normal range of motion.     Left elbow: No swelling, deformity or lacerations. Normal range of motion.     Right forearm: Swelling and tenderness present. No edema, deformity, lacerations or bony tenderness.     Left forearm: No swelling, edema, deformity, lacerations, tenderness or bony tenderness.     Right wrist: No swelling, deformity, lacerations or tenderness. Normal range of motion.     Left wrist: No swelling, deformity, lacerations or tenderness. Normal range of motion.     Right hand: No  swelling, deformity or lacerations. Normal range of motion.     Left hand: No swelling, deformity or lacerations. Normal range of motion.       Arms:     Cervical back: Normal range of motion and neck supple. No swelling, edema, deformity, erythema, signs of trauma, lacerations, rigidity, torticollis, tenderness or crepitus. Normal range of motion.     Thoracic back: No swelling, edema, deformity, signs of trauma or lacerations.     Lumbar back: No swelling, edema, deformity, signs of trauma or lacerations.     Right lower leg: No deformity, lacerations, tenderness or bony tenderness.     Left lower leg: Tenderness present. No deformity, lacerations or bony tenderness.     Right ankle: No swelling, deformity, ecchymosis or lacerations.     Left ankle: No swelling, deformity, ecchymosis or lacerations.       Legs:     Comments: Macular erythema 2cm surrounding pimple macerated medial left lower leg; tan discharge on  bandaid gauze and patient reported applied triple antibiotic this am  Lymphadenopathy:     Head:     Right side of head: No submental, submandibular, tonsillar, preauricular, posterior auricular or occipital adenopathy.     Left side of head: No submental, submandibular, tonsillar, preauricular, posterior auricular or occipital adenopathy.     Cervical: No cervical adenopathy.     Right cervical: No superficial cervical adenopathy.    Left cervical: No superficial cervical adenopathy.  Skin:    General: Skin is warm.     Capillary Refill: Capillary refill takes less than 2 seconds.     Coloration: Skin is not ashen, cyanotic, jaundiced, mottled, pale or sallow.     Findings: Abrasion, erythema, rash and wound present. No abscess, acne, bruising, burn, ecchymosis, laceration, lesion or petechiae. Rash is macular, papular and pustular. Rash is not crusting, nodular, purpuric, scaling, urticarial or vesicular.     Nails: There is no clubbing.          Comments: Dry brown scab on papule posterior to macerated papule left lower leg; noted patient occasionally itching right forearm in exam room  Neurological:     General: No focal deficit present.     Mental Status: He is alert and oriented to person, place, and time. Mental status is at baseline.     GCS: GCS eye subscore is 4. GCS verbal subscore is 5. GCS motor subscore is 6.     Cranial Nerves: Cranial nerves are intact. No cranial nerve deficit, dysarthria or facial asymmetry.     Sensory: Sensation is intact. No sensory deficit.     Motor: Motor function is intact. No weakness, tremor, atrophy, abnormal muscle tone or seizure activity.     Coordination: Coordination is intact. Coordination normal.     Gait: Gait is intact. Gait normal.     Comments: Gait sure and steady in clinic; on/off exam table and in/out of chair without difficulty; bilateral hand grasp equal 5/5  Psychiatric:        Attention and Perception: Attention and perception  normal.        Mood and Affect: Mood and affect normal.        Speech: Speech normal.        Behavior: Behavior normal. Behavior is cooperative.        Thought Content: Thought content normal.        Cognition and Memory: Cognition and memory normal.        Judgment: Judgment normal.  Left TMJ TTP when jaw open and trapezius tight applied thermacare cervical from clinic stock to posterior neck.  Patient instructed may return to clinic tomorrow for another from RN Rolly Salter if helpful today.  Patient verbalized understanding information/instructions, agreed with plan of care and had no further questions at this time.    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  Topical therapy since less than 10% body surface area affected.  May apply to right forearm poison ivy rash prn itching/alternate with ice/calagel topical.  May restart zyrtec 10mg  po BID prn itching if topical not strong enough.  calagel thin smear BID prn itching; do not get in eyes; if worsening with calagel use stop and trial hydrocortisone 1% topical BID prn rash/itching.  Wash hands before and after application.  Avoid hot steam showers. Avoid scratching as can turn into cellulitis/infection.   Apply emollient twice a day e.g. Fragrance free vaseline/aquaphor/eucerin.  May apply ice/cold compress 5 minutes QID prn itching/swelling. Shower when he finishes work/prior to bedtime.  Avoid harsh/abrasive soaps use fragrance free/sensitive like dove/cetaphil.    Medication as directed. Call or return to clinic as needed if these symptoms worsen or fail to improve as anticipated. Exitcare handout on contact dermatitis printed and given to patient  Follow up for re-evaluation in 48 hours if no improvement and/or worsening of rash with plan of care. Patient verbalized  agreement and understanding of treatment plan and had no further questions at this time,   Exitcare handout on skin infection printed and given to patient. RTC if worsening erythema, pain, purulent discharge, fever. Wash towels, washcloths, sheets in hot water with bleach every couple of days until infection resolved. Wash area with soap and water at least daily.  Apply triple antibiotic daily to BID with dressing change.  Cover with bandage until healed over. Keflex interacts with his chronic medication lowering bioavailability therefore amoxicillin 875mg  po BID x 7 days #20 RF0 dispensed to patient from [facility] per up to date recommendation treatment cellulitis.   Patient verbalized understanding, agreed with plan of care and had no further questions at this time.  Up to date handout sent to personal email aol on syphillis patient basics, reviewed symptoms primary, secondary, latent and tertiary with patient.  Primary 2-3 weeks after infection raised bump where infection entered body usually painless turns into ulcer like lesion (chancre) and heal on own typically penis/rectum.  Secondary widespread rash, fever, headache, sore throat, muscle aches, swollen lymph nodes, and/or weight loss.  Latent no symptoms.  Late stage (tertiary) can spread to heart, skin, brain, other organs and cause stiff neck, nausea, vomiting, headache, confusion, and/or vision loss/changes.  I recommended testing for other STDs also when he is tested for syphillis.  Patient stated he will schedule testing and follow up with PCM due to contract limitations do not allow STD testing at [facility] clinic.  Patient asked if subsequent blood draws can be done at [facility] if order received from [facility].  Discussed with patient RN [facility] would need order to check if we have supplies/any special tubes required depending on patient orders.  Patient denied chancre, enlarged lymph nodes, headache, neck pain, body wide rash.  He will continue to monitor  for symptoms and seek re-evaluation if symptoms occur.  Patient verbalized understanding information/instructions, agreed with plan of care and had no further  questions at this time.

## 2020-01-09 NOTE — Telephone Encounter (Signed)
Patient left voicemail requesting call back regarding medication denial. Patient has not seen in clinic since 10/2018. Left second voicemail with details regarding reason for medication denial. Advised he call office back and schedule appointment. Lorenso Courier, [place]

## 2020-01-21 ENCOUNTER — Other Ambulatory Visit: Payer: Self-pay | Admitting: *Deleted

## 2020-01-21 DIAGNOSIS — Z79899 Other long term (current) drug therapy: Secondary | ICD-10-CM

## 2020-01-21 DIAGNOSIS — Z113 Encounter for screening for infections with a predominantly sexual mode of transmission: Secondary | ICD-10-CM

## 2020-01-21 DIAGNOSIS — B20 Human immunodeficiency virus [HIV] disease: Secondary | ICD-10-CM

## 2020-01-22 ENCOUNTER — Other Ambulatory Visit: Payer: PRIVATE HEALTH INSURANCE

## 2020-01-22 ENCOUNTER — Other Ambulatory Visit: Payer: Self-pay

## 2020-01-22 ENCOUNTER — Other Ambulatory Visit (HOSPITAL_COMMUNITY)
Admission: RE | Admit: 2020-01-22 | Discharge: 2020-01-22 | Disposition: A | Discharge status: Home / Self Care | Payer: PRIVATE HEALTH INSURANCE | Source: Ambulatory Visit | Attending: Infectious Diseases | Admitting: Infectious Diseases

## 2020-01-22 DIAGNOSIS — Z113 Encounter for screening for infections with a predominantly sexual mode of transmission: Secondary | ICD-10-CM | POA: Diagnosis present

## 2020-01-22 DIAGNOSIS — Z79899 Other long term (current) drug therapy: Secondary | ICD-10-CM

## 2020-01-22 DIAGNOSIS — B20 Human immunodeficiency virus [HIV] disease: Secondary | ICD-10-CM | POA: Insufficient documentation

## 2020-01-23 LAB — URINE CYTOLOGY ANCILLARY ONLY
Chlamydia: NEGATIVE
Comment: NEGATIVE
Comment: NORMAL
Neisseria Gonorrhea: NEGATIVE

## 2020-01-23 LAB — T-HELPER CELL (CD4) - (RCID CLINIC ONLY)
CD4 % Helper T Cell: 40 % (ref 33–65)
CD4 T Cell Abs: 488 /uL (ref 400–1790)

## 2020-01-25 LAB — CBC WITH DIFFERENTIAL/PLATELET
Absolute Monocytes: 449 cells/uL (ref 200–950)
Basophils Absolute: 21 cells/uL (ref 0–200)
Basophils Relative: 0.5 %
Eosinophils Absolute: 59 cells/uL (ref 15–500)
Eosinophils Relative: 1.4 %
HCT: 47.5 % (ref 38.5–50.0)
Hemoglobin: 16.6 g/dL (ref 13.2–17.1)
Lymphs Abs: 1113 cells/uL (ref 850–3900)
MCH: 33.1 pg — ABNORMAL HIGH (ref 27.0–33.0)
MCHC: 34.9 g/dL (ref 32.0–36.0)
MCV: 94.8 fL (ref 80.0–100.0)
MPV: 12.5 fL (ref 7.5–12.5)
Monocytes Relative: 10.7 %
Neutro Abs: 2558 cells/uL (ref 1500–7800)
Neutrophils Relative %: 60.9 %
Platelets: 139 10*3/uL — ABNORMAL LOW (ref 140–400)
RBC: 5.01 10*6/uL (ref 4.20–5.80)
RDW: 12.3 % (ref 11.0–15.0)
Total Lymphocyte: 26.5 %
WBC: 4.2 10*3/uL (ref 3.8–10.8)

## 2020-01-25 LAB — COMPLETE METABOLIC PANEL WITH GFR
AG Ratio: 2.2 (calc) (ref 1.0–2.5)
ALT: 24 U/L (ref 9–46)
AST: 20 U/L (ref 10–35)
Albumin: 4.3 g/dL (ref 3.6–5.1)
Alkaline phosphatase (APISO): 62 U/L (ref 35–144)
BUN/Creatinine Ratio: 22 (calc) (ref 6–22)
BUN: 27 mg/dL — ABNORMAL HIGH (ref 7–25)
CO2: 26 mmol/L (ref 20–32)
Calcium: 9.4 mg/dL (ref 8.6–10.3)
Chloride: 108 mmol/L (ref 98–110)
Creat: 1.21 mg/dL (ref 0.70–1.33)
GFR, Est African American: 78 mL/min/{1.73_m2} (ref >=60)
GFR, Est Non African American: 67 mL/min/{1.73_m2} (ref >=60)
Globulin: 2 g/dL (calc) (ref 1.9–3.7)
Glucose, Bld: 85 mg/dL (ref 65–99)
Potassium: 4.5 mmol/L (ref 3.5–5.3)
Sodium: 143 mmol/L (ref 135–146)
Total Bilirubin: 0.6 mg/dL (ref 0.2–1.2)
Total Protein: 6.3 g/dL (ref 6.1–8.1)

## 2020-01-25 LAB — RPR TITER: RPR Titer: 1:2 {titer} — ABNORMAL HIGH

## 2020-01-25 LAB — LIPID PANEL
Cholesterol: 204 mg/dL — ABNORMAL HIGH (ref <200)
HDL: 46 mg/dL (ref >=40)
LDL Cholesterol (Calc): 126 mg/dL (calc) — ABNORMAL HIGH
Non-HDL Cholesterol (Calc): 158 mg/dL (calc) — ABNORMAL HIGH (ref <130)
Total CHOL/HDL Ratio: 4.4 (calc) (ref <5.0)
Triglycerides: 202 mg/dL — ABNORMAL HIGH (ref <150)

## 2020-01-25 LAB — RPR: RPR Ser Ql: REACTIVE — AB

## 2020-01-25 LAB — FLUORESCENT TREPONEMAL AB(FTA)-IGG-BLD: Fluorescent Treponemal ABS: REACTIVE — AB

## 2020-01-25 LAB — HIV-1 RNA QUANT-NO REFLEX-BLD
HIV 1 RNA Quant: <20 Copies/mL
HIV-1 RNA Quant, Log: <1.3 Log cps/mL

## 2020-02-07 ENCOUNTER — Encounter: Payer: Self-pay | Admitting: Infectious Diseases

## 2020-02-07 ENCOUNTER — Ambulatory Visit (INDEPENDENT_AMBULATORY_CARE_PROVIDER_SITE_OTHER): Payer: PRIVATE HEALTH INSURANCE | Admitting: Infectious Diseases

## 2020-02-07 ENCOUNTER — Other Ambulatory Visit: Payer: Self-pay

## 2020-02-07 VITALS — BP 124/85 | HR 82 | Temp 98.2°F | Ht 71.0 in | Wt 213.0 lb

## 2020-02-07 DIAGNOSIS — R6882 Decreased libido: Secondary | ICD-10-CM | POA: Diagnosis not present

## 2020-02-07 DIAGNOSIS — R600 Localized edema: Secondary | ICD-10-CM | POA: Diagnosis not present

## 2020-02-07 DIAGNOSIS — Z23 Encounter for immunization: Secondary | ICD-10-CM | POA: Diagnosis not present

## 2020-02-07 DIAGNOSIS — B20 Human immunodeficiency virus [HIV] disease: Secondary | ICD-10-CM

## 2020-02-07 DIAGNOSIS — Z113 Encounter for screening for infections with a predominantly sexual mode of transmission: Secondary | ICD-10-CM | POA: Diagnosis not present

## 2020-02-07 DIAGNOSIS — Z79899 Other long term (current) drug therapy: Secondary | ICD-10-CM

## 2020-02-07 MED ORDER — TADALAFIL 5 MG PO TABS: ORAL_TABLET | ORAL | 2 refills | Status: DC

## 2020-02-07 MED ORDER — DOVATO 50-300 MG PO TABS: 1.0000 | ORAL_TABLET | Freq: Every day | ORAL | 3 refills | Status: DC

## 2020-02-07 NOTE — Progress Notes (Signed)
   Subjective:    Patient ID: Barry Zhang, male    DOB: Aug 15, 1965, 54 y.o.   MRN: [id number]  HPI 54yo M with hx of HIV+. Previously refused colonoscopy (still pushing to have at 55).  He has been on dovato (07-2017 change from atripla started 09-2011).No problems with ART.  He was seen 02-2018 with major depression. He was started on zoloft and was given work excuse. He has had multiple f/u visits with [name].  Working in person again now, very busy.  Was seen in ED for chest pain, arm tightness, dyspnea, clammy, htn. Went to ED, no MI. W/u negative. BP returned to normal.  Out of cialis, does not believe episode was related to this.  Not able to exercise as much as previous but does feel like his work is more vigorous.   HIV 1 RNA Quant  Date Value  01/22/2020 <20 Copies/mL  10/17/2018 <20 NOT DETECTED copies/mL  06/02/2017 <20 Copies/mL   CD4 T Cell Abs (/uL)  Date Value  01/22/2020 488  10/17/2018 429  06/02/2017 400    Review of Systems  Constitutional: Negative for appetite change and unexpected weight change.  Respiratory: Negative for cough and shortness of breath.   Cardiovascular: Negative for chest pain.  Gastrointestinal: Negative for constipation and diarrhea.  Genitourinary: Negative for difficulty urinating.  Please see HPI. All other systems reviewed and negative.      Objective:   Physical Exam Vitals reviewed.  Constitutional:      Appearance: Normal appearance.  HENT:     Mouth/Throat:     Mouth: Mucous membranes are dry.  Eyes:     Extraocular Movements: Extraocular movements intact.     Pupils: Pupils are equal, round, and reactive to light.  Cardiovascular:     Rate and Rhythm: Normal rate and regular rhythm.  Pulmonary:     Effort: Pulmonary effort is normal.     Breath sounds: Normal breath sounds.  Abdominal:     General: Bowel sounds are normal. There is no distension.     Palpations: Abdomen is soft.     Tenderness: There is no  abdominal tenderness.  Musculoskeletal:        General: Normal range of motion.     Cervical back: Normal range of motion and neck supple.     Right lower leg: No edema.     Left lower leg: No edema.  Neurological:     General: No focal deficit present.     Mental Status: He is alert.  Psychiatric:        Mood and Affect: Mood normal.           Assessment & Plan:

## 2020-02-07 NOTE — Assessment & Plan Note (Signed)
Will refill his cialis.

## 2020-02-07 NOTE — Assessment & Plan Note (Signed)
Offered/refused condoms.  Flu vax today Needs PCV23 Refilled his dovato, cialis.  rtc in 1 year.

## 2020-02-07 NOTE — Assessment & Plan Note (Addendum)
Trace at best.  His CV w/u was negative Encouraged him to use compression socks, keep legs elevated as he is able.  If persists- tte, le dopler.  His calves are not swollen, tender or painful.

## 2020-02-07 NOTE — Addendum Note (Signed)
Addended by: Linna Hoff D on: 02/07/2020 03:13 PM   Modules accepted: Orders

## 2020-02-18 ENCOUNTER — Other Ambulatory Visit: Payer: PRIVATE HEALTH INSURANCE

## 2020-03-03 ENCOUNTER — Other Ambulatory Visit: Payer: Self-pay | Admitting: Infectious Diseases

## 2020-03-03 DIAGNOSIS — R6882 Decreased libido: Secondary | ICD-10-CM

## 2020-04-08 ENCOUNTER — Telehealth: Payer: Self-pay | Admitting: *Deleted

## 2020-04-08 ENCOUNTER — Encounter: Payer: Self-pay | Admitting: *Deleted

## 2020-04-08 NOTE — Telephone Encounter (Signed)
Reviewed RN Haley note agreed with plan of care. 

## 2020-04-08 NOTE — Telephone Encounter (Signed)
RN notified by HR that pt had called out of work the last 2 days due to stomach issues. Spoke with pt over phone. He reports that Saturday he ate with family and had a spicy chili and a rich dessert among other things that are outside of his typically "neutral/bland" diet. On Sunday afternoon, 12/12, he began having diarrhea, multiple episodes. Slowed on Monday. No episodes since late Monday evening (12/13). He has been eating crackers and drinking ginger ale. Today he ate broiled chicken and rice for lunch and has had no additional episodes. Denies n/v, fever/chills, body aches, URI sx. Feels well now and is rehydrating. Sx very likely r/t significant change in diet. No concern for Covid and no quarantine or testing indicated at this time. Pt made aware to contact clinic with any new or recurrent sx but otherwise is cleared to RTW tomorrow 04/09/20. HR made aware of personal medical , non-Covid and clearance to return.

## 2020-04-11 NOTE — Telephone Encounter (Signed)
Confirmed with pt he did RTW on 04/09/20 as expected after no recurrence of sx. No further needs. Closing encounter.

## 2020-04-12 NOTE — Telephone Encounter (Signed)
Noted RTW as expected. 

## 2020-05-05 ENCOUNTER — Telehealth: Payer: Self-pay | Admitting: *Deleted

## 2020-05-05 NOTE — Telephone Encounter (Signed)
Patient would like to come in for "test of cure" for syphilis. He states he was treated in August/September at his primary care provider, but does not want to return there (he is in the process of finding a new primary care doctor). He is not sure what he was treated with or what his most recent lab results were.  Per our records, patient has been serofast at 1:2.   RN reached out to [facility], spoke with [place]. Per their records, RPR on 01/08/20 was 1:1. Our lab drew RPR 01/22/20, was 1:2.  Only treatment history on [facility] record was 09/07/2013, 2.4 million units once; no treatment on [facility] record in 2021.  He is scheduled with Dr Ninetta Lights for 1/13. Andree Coss, RN

## 2020-05-06 NOTE — Telephone Encounter (Signed)
He should have RPR in his pre-visit labs.  Glad to check it again if not, when he comes on 05-08-20 Thanks jeff

## 2020-05-08 ENCOUNTER — Other Ambulatory Visit: Payer: Self-pay

## 2020-05-08 ENCOUNTER — Encounter: Payer: Self-pay | Admitting: Infectious Diseases

## 2020-05-08 ENCOUNTER — Ambulatory Visit (INDEPENDENT_AMBULATORY_CARE_PROVIDER_SITE_OTHER): Payer: PRIVATE HEALTH INSURANCE | Admitting: Infectious Diseases

## 2020-05-08 DIAGNOSIS — A539 Syphilis, unspecified: Secondary | ICD-10-CM | POA: Diagnosis not present

## 2020-05-08 DIAGNOSIS — L239 Allergic contact dermatitis, unspecified cause: Secondary | ICD-10-CM | POA: Diagnosis not present

## 2020-05-08 DIAGNOSIS — L259 Unspecified contact dermatitis, unspecified cause: Secondary | ICD-10-CM | POA: Insufficient documentation

## 2020-05-08 MED ORDER — BETAMETHASONE DIPROPIONATE 0.05 % EX CREA: TOPICAL_CREAM | Freq: Two times a day (BID) | CUTANEOUS | 1 refills | Status: AC

## 2020-05-08 NOTE — Progress Notes (Signed)
   Subjective:    Patient ID: Barry Zhang, male  DOB: 10-12-1965, 55 y.o.        MRN: [id number]   HPI 55yo M with hx of HIV+. Previously refused colonoscopy (still pushing to have at 55).  He has been on dovato (07-2017 change from atripla started 09-2011).No problems with this. He was seen 02-2018 with major depression. He was started on zoloft and was given work excuse. He has had multiple f/u visits with Barry Zhang.  Working in person again now, very busy.  Prev seen in ED for chest pain, arm tightness, dyspnea, clammy, htn. Went to ED, no MI. W/u negative. BP returned to normal He has had no further issues.   Has questions about hx of syphilis rx. Got IM Pen x 3 after RPR+ fall of 2021. RPR 1:2. He had a new contact at that time. THey were positive for syphilis, had a rash.  Prev treated 2016 with RPR 1:32. One injection, once    He has had a new rsah on his B shins. He has not been wood-working. Has been using HCT cream, getting better.   HIV 1 RNA Quant  Date Value  01/22/2020 <20 Copies/mL  10/17/2018 <20 NOT DETECTED copies/mL  06/02/2017 <20 Copies/mL   CD4 T Cell Abs (/uL)  Date Value  01/22/2020 488  10/17/2018 429  06/02/2017 400     Health Maintenance  Topic Date Due  . TETANUS/TDAP  Never done  . COLONOSCOPY (Pts 45-51yrs Insurance coverage will need to be confirmed)  Never done  . COVID-19 Vaccine (3 - Pfizer risk 4-dose series) 09/11/2019  . INFLUENZA VACCINE  Completed  . Hepatitis C Screening  Completed  . HIV Screening  Completed      Review of Systems  Constitutional: Negative for chills and fever.  Genitourinary: Negative for dysuria and urgency.  Skin: Positive for itching and rash.    Please see HPI. All other systems reviewed and negative.     Objective:  Physical Exam Vitals reviewed.  Constitutional:      General: He is not in acute distress.    Appearance: He is normal weight. He is not ill-appearing.  Skin:       Neurological:     Mental Status: He is alert.            Assessment & Plan:

## 2020-05-08 NOTE — Assessment & Plan Note (Signed)
We discussed his RPRs, he is serofast His exposure would have indicated a tx for PEN G x 1.  Appreciate Dr Polite's f/u.

## 2020-05-08 NOTE — Assessment & Plan Note (Signed)
Denies new soaps, shampoos.  No new detergents.  Suspect poison ivy (or similar) reaction.  Will give him a slightly stronger steroid than the hydrocortisone.

## 2020-05-09 ENCOUNTER — Telehealth: Payer: Self-pay

## 2020-05-09 NOTE — Telephone Encounter (Signed)
Prior Auth (EOC) ID: [id number]   Drug/Service Name: DOVATO 50-300 MG TABLET  Patient: Barry Zhang   Date Requested: 05/09/2020 5:08:04     MemberID:[id number]   Will continue to follow status of PA via [email or web address]

## 2020-05-14 ENCOUNTER — Telehealth: Payer: Self-pay

## 2020-05-14 NOTE — Telephone Encounter (Signed)
[facility] Patient Advocate Encounter   Received notification from [facility] that prior authorization for DOVATO is required.   PA submitted on 05/14/20 Key [id number] Status is pending  Once PA is approved call [facility] at [phone] RX [phone]    [facility] will continue to follow.   Clearance Coots, CPhT Specialty Pharmacy Patient [facility] Phone: [phone] Fax:  [phone]

## 2020-05-16 NOTE — Telephone Encounter (Signed)
Received fax from Rx benefits requesting additional records to support PA for Dovato. CMA will fax last note and labs to [phone]. EOC ID: [id number] Lorenso Courier, CMA

## 2020-05-19 NOTE — Telephone Encounter (Signed)
Received notification via fax from [employer] for approval from [facility]  05/16/20-05/15/2021 Pharmacy notified Valarie Cones

## 2020-05-26 ENCOUNTER — Encounter: Payer: Self-pay | Admitting: *Deleted

## 2020-05-26 ENCOUNTER — Telehealth: Payer: Self-pay | Admitting: *Deleted

## 2020-05-26 DIAGNOSIS — K219 Gastro-esophageal reflux disease without esophagitis: Secondary | ICD-10-CM | POA: Insufficient documentation

## 2020-05-26 DIAGNOSIS — D72819 Decreased white blood cell count, unspecified: Secondary | ICD-10-CM | POA: Insufficient documentation

## 2020-05-26 DIAGNOSIS — U071 COVID-19: Secondary | ICD-10-CM

## 2020-05-26 NOTE — Telephone Encounter (Signed)
RN notified by HR that pt emailed Taskforce this morning reporting congestion and sore throat that began late 05/25/20. Sx persisted this morning. Temp 99.0 at 0430 today.  Spoke with pt by phone. He reports nasal congestion and mild soret throat. No new or worsening sx.   Day 0 05/25/20 Day 5 05/30/20 Strict mask use thru Day 10 06/04/20 Testing [facility] 05/28/20 10:00.

## 2020-05-26 NOTE — Telephone Encounter (Signed)
Reviewed RN Haley note agreed with plan of care. 

## 2020-05-29 NOTE — Telephone Encounter (Signed)
Spoke with pt by phone. He reports no fever since low grade on Monday 1/31. Chest congestion moving, getting mucus up. Has not paid attention to color or consistency of it. Nasal congestion improving slowly. Completed covid testing yesterday as scheduled at [facility]. Plan weekend f/u for results and clearance to RTW Monday 2/7 as expected. Pt agreeable with this. Denies any current questions or concerns.

## 2020-05-29 NOTE — Telephone Encounter (Signed)
Reviewed RN Rolly Salter note covid test results pending and patient with productive cough will follow up with him this weekend.

## 2020-05-30 NOTE — Telephone Encounter (Signed)
Pt called RN reporting positive Covid test result. HR Tim already made aware by pt. Pt reports nasal congestion is main symptom currently. Has been taking Dayquil/Nyquil at home. Advised to add nasal saline spray q2h and as nasal rinse. Will plan f/u over the weekend to evaluate if ready to RTW on Monday 2/7 as planned. Pt agreeable with plan. Denies further needs or concerns.

## 2020-05-31 NOTE — Telephone Encounter (Signed)
Reviewed RN Rolly Salter note agreed with plan of care and will follow up via telephone 06/01/20 with patient.

## 2020-06-01 MED ORDER — FLUTICASONE PROPIONATE 50 MCG/ACT NA SUSP
1.0000 | Freq: Two times a day (BID) | NASAL | 0 refills | Status: DC
Start: 2020-06-01 — End: 2021-01-07

## 2020-06-01 NOTE — Telephone Encounter (Signed)
Patient contacted via telephone and stated saline is helping during day.  Still tired and not much energy.  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  Flonase use morning.  Every day is a little better but today only able to shower, cook lunch and one load of laundry and then wiped out.  Trying to pace self and build activities.  Cough coming and going.  Sore throat resolved.  Congestion intermittent also. Has been checking temperature and stated last fever 1 Feb 22.  Patient ran out of dayquil and nyquil so only using saline nasal now.  Patient stated po intake going well.  Duration of telephone call 4 minutes intermittent dry non harsh cough and nasal congestion noted; no throat clearing.  Spoke full sentences without difficulty.  Discussed with patient RN Rolly Salter will call him to follow up again tomorrow.  Patient verbalized understanding information/instructions, agreed with plan of care and had no further questions at this time.  HR notified patient still fatigue/cough not ready to return tomorrow.

## 2020-06-02 NOTE — Telephone Encounter (Signed)
Spoke with pt by phone. He reports feeling better yesterday than today. More cough and throat clearing with mucus moving today. Still only using saline and Flonase. Discussed Flonase dosing. Was only using 1 spray daily, will increase to 1 spray BID. Has Mucus relief DM tabs at home. Will start these for the congestion. Advised not to take less than 2-3 hours before bed.  Still with fatigue today, but notes only one 45 min nap today vs 2-3 longer naps yesterday. Does not feel he has enough energy for work tomorrow. Plan to be out tomorrow and f/u again towards end of day.

## 2020-06-03 NOTE — Telephone Encounter (Signed)
Patient contacted via telephone still having runny nose and some congestion.  Has used nasal saline twice so far today and planning to use again in an hour.  Denied fever/chills/foul taste or worsening symptoms.  Feeling better today and plans to return onsite tomorrow.  Discussed no employee lunch room use through 06/04/2020 and strict mask use.  May use meditation room to eat inside as temperatures still cool outside/freezing at night this week.  Patient spoke full sentences without difficulty, no cough/nasal sniffing/throat clearing noted during 4 minute telephone call.  Patient verbalized understanding information/instructions, agreed with plan of care and had no further questions at this time.  HR notified patient cleared and plans to return onsite tomorrow 06/04/2020

## 2020-06-03 NOTE — Telephone Encounter (Signed)
Reviewed RN Rolly Salter note agreed with plan of care will follow up with patient 06/03/2020

## 2020-06-05 NOTE — Telephone Encounter (Signed)
Pt called RN reporting that he worked a half day yesterday 2/9 upon his return. Is working a half day today and tomorrow with supervisor's approval. HR asked him to touch base with clinic regarding continued sx.  Pt reports fatigue is his worst sx. Worked 4 hours yesterday and went straight to sleep when he went home. Some congestion still but much improved when taking Mucus DM. Offered NP virtual visit today. Pt declines, does not feel he needs any prescriptions, just rest. Off all weekend and plans to rest at home. Advised pt if he does not feel able to work full schedule by Monday 2/14, contact pcp for f/u and work restrictions/note. Pt agreeable with this and denies further questions/concerns.

## 2020-06-05 NOTE — Telephone Encounter (Signed)
Noted patient with fatigue unable to complete full shift at work.   Reviewed RN Rolly Salter note.   Planning to pace himself this week and follow up with PCM next week if work restrictions needed.

## 2020-06-09 NOTE — Telephone Encounter (Signed)
Spoke with pt by phone. He worked half shift Friday as planned. Rested all weekend. Completed full shift today with minimal fatigue. Does not feel he needs ongoing restrictions. Closing encounter.

## 2020-06-10 NOTE — Telephone Encounter (Signed)
Reviewed RN Rolly Salter note patient worked full shift yesterday no further questions or concerns and day 10 strict mask use completed 06/04/20

## 2020-06-13 NOTE — Telephone Encounter (Signed)
Received another request for PA for Dovato.   PA had already been approved. Call [facility] to confirm. There where some issue on there end. These issues now resolved. Patient has $0 copay.   Called patient to made him aware 90 day supply was approved.  Valarie Cones

## 2020-08-12 ENCOUNTER — Other Ambulatory Visit: Payer: Self-pay

## 2020-08-12 ENCOUNTER — Encounter: Payer: Self-pay | Admitting: Registered Nurse

## 2020-08-12 ENCOUNTER — Ambulatory Visit: Payer: Self-pay | Admitting: Registered Nurse

## 2020-08-12 VITALS — BP 123/89 | HR 72 | Temp 97.3°F

## 2020-08-12 DIAGNOSIS — R140 Abdominal distension (gaseous): Secondary | ICD-10-CM

## 2020-08-12 DIAGNOSIS — R1013 Epigastric pain: Secondary | ICD-10-CM

## 2020-08-12 MED ORDER — OMEPRAZOLE 20 MG PO CPDR: 20.0000 mg | DELAYED_RELEASE_CAPSULE | Freq: Every day | ORAL | 0 refills | Status: DC

## 2020-08-12 MED ORDER — CALCIUM CARBONATE ANTACID 500 MG PO CHEW: 1.0000 | CHEWABLE_TABLET | Freq: Four times a day (QID) | ORAL | Status: DC | PRN

## 2020-08-12 NOTE — Progress Notes (Signed)
Subjective:    Patient ID: Barry Zhang, male    DOB: [birth year], 55 y.o.   MRN: [id number]  54y/o established male pt c/o upper abd discomfort, bloated sensation x4 days. Began taking Alka-Seltzer 2 days ago with mild relief. Feels like if he could vomit, he would feel better. Sensation that everything is sitting at epigastric area. Denies diarrhea or emesis. Increased flatulence and stools brown slightly lighter colored than usual, more frequent and looser than usually but not watery or bloody red/black. Denied change in belching. Feels worse after eating. Movement/activity does not seem to worsen or improve symptoms. "I have tried pushing on my stomach everywhere and I can't find any sore spot" Denied fever/chills/rash/coffee ground or tarry stools.  Denied known sick contacts.     Review of Systems  Constitutional: Positive for appetite change. Negative for activity change, chills, diaphoresis, fatigue and fever.  HENT: Negative for trouble swallowing and voice change.   Eyes: Negative for photophobia and visual disturbance.  Respiratory: Negative for cough, shortness of breath, wheezing and stridor.   Cardiovascular: Negative for leg swelling.  Gastrointestinal: Positive for abdominal distention, abdominal pain and nausea. Negative for anal bleeding, blood in stool, constipation, diarrhea, rectal pain and vomiting.  Endocrine: Negative for cold intolerance and heat intolerance.  Genitourinary: Negative for difficulty urinating.  Musculoskeletal: Negative for back pain, gait problem, joint swelling, myalgias, neck pain and neck stiffness.  Skin: Negative for rash.  Allergic/Immunologic: Positive for environmental allergies. Negative for food allergies.  Neurological: Negative for dizziness, tremors, seizures, syncope, facial asymmetry, speech difficulty, weakness, light-headedness, numbness and headaches.  Hematological: Negative for adenopathy. Does not bruise/bleed easily.   Psychiatric/Behavioral: Negative for agitation, confusion and sleep disturbance.       Objective:   Physical Exam Vitals and nursing note reviewed.  Constitutional:      General: He is awake. He is not in acute distress.    Appearance: Normal appearance. He is well-developed, well-groomed and overweight. He is not ill-appearing, toxic-appearing or diaphoretic.  HENT:     Head: Normocephalic and atraumatic.     Jaw: There is normal jaw occlusion.     Right Ear: Hearing and external ear normal.     Left Ear: Hearing and external ear normal.     Nose: Nose normal.     Mouth/Throat:     Lips: Pink. No lesions.     Mouth: Mucous membranes are moist.     Pharynx: Oropharynx is clear. No pharyngeal swelling, oropharyngeal exudate or posterior oropharyngeal erythema.  Eyes:     General: Lids are normal. Vision grossly intact. Gaze aligned appropriately. Allergic shiner present. No visual field deficit or scleral icterus.       Right eye: No discharge.        Left eye: No discharge.     Extraocular Movements: Extraocular movements intact.     Conjunctiva/sclera: Conjunctivae normal.     Pupils: Pupils are equal, round, and reactive to light.  Neck:     Trachea: Trachea and phonation normal. No tracheal deviation.  Cardiovascular:     Rate and Rhythm: Normal rate and regular rhythm.     Pulses: Normal pulses.          Radial pulses are 2+ on the right side and 2+ on the left side.     Heart sounds: Normal heart sounds.  Pulmonary:     Effort: Pulmonary effort is normal. No respiratory distress.     Breath sounds: Normal breath sounds  and air entry. No stridor or transmitted upper airway sounds. No wheezing.     Comments: No cough observed in exam room; spoke full sentences without difficulty Abdominal:     General: Abdomen is flat. Bowel sounds are normal. There is distension. There is no abdominal bruit. There are no signs of injury.     Palpations: Abdomen is soft. There is no  shifting dullness, fluid wave, hepatomegaly, splenomegaly, mass or pulsatile mass.     Tenderness: There is no abdominal tenderness. There is no right CVA tenderness, left CVA tenderness, guarding or rebound. Negative signs include Murphy's sign.     Hernia: No hernia is present. There is no hernia in the umbilical area or ventral area.       Comments: Normoactive bowel sounds x 4 quads; dull to percussion x 4 quads; standing to sitting to supine and reverse quickly without difficulty/pain or need for assistance  Musculoskeletal:        General: No swelling or tenderness. Normal range of motion.     Right shoulder: No swelling, deformity, effusion or laceration.     Left shoulder: No swelling, deformity, effusion or laceration.     Right elbow: No swelling, deformity, effusion or lacerations. Normal range of motion.     Left elbow: No swelling, deformity, effusion or lacerations. Normal range of motion.     Right hand: No swelling, deformity or lacerations. Normal range of motion. Normal strength.     Left hand: No swelling, deformity or lacerations. Normal range of motion. Normal strength.     Cervical back: Normal range of motion and neck supple. No swelling, edema, deformity, erythema, signs of trauma, lacerations, rigidity or crepitus. No pain with movement. Normal range of motion.     Thoracic back: No swelling, edema, deformity, signs of trauma or lacerations. Normal range of motion.     Lumbar back: No swelling, edema, deformity, signs of trauma or lacerations. Normal range of motion.     Right lower leg: No edema.     Left lower leg: No edema.  Lymphadenopathy:     Head:     Right side of head: No preauricular adenopathy.     Left side of head: No preauricular adenopathy.     Cervical: No cervical adenopathy.     Right cervical: No superficial cervical adenopathy.    Left cervical: No superficial cervical adenopathy.  Skin:    General: Skin is warm and dry.     Capillary Refill:  Capillary refill takes less than 2 seconds.     Coloration: Skin is not ashen, cyanotic, jaundiced, mottled, pale or sallow.     Findings: No abrasion, abscess, acne, bruising, burn, ecchymosis, erythema, signs of injury, laceration, lesion, petechiae, rash or wound.     Nails: There is no clubbing.  Neurological:     General: No focal deficit present.     Mental Status: He is alert and oriented to person, place, and time. Mental status is at baseline.     GCS: GCS eye subscore is 4. GCS verbal subscore is 5. GCS motor subscore is 6.     Cranial Nerves: Cranial nerves are intact. No cranial nerve deficit, dysarthria or facial asymmetry.     Sensory: Sensation is intact. No sensory deficit.     Motor: Motor function is intact. No weakness, tremor, atrophy or seizure activity.     Coordination: Coordination is intact. Coordination normal.     Gait: Gait is intact. Gait normal.  Comments: Gait sure and steady ; bilateral hand grasp equal 5/5; gait sure and steady in clinic  Psychiatric:        Attention and Perception: Attention and perception normal.        Mood and Affect: Mood and affect normal. Mood is not anxious or depressed.        Speech: Speech normal.        Behavior: Behavior normal. Behavior is cooperative.        Thought Content: Thought content normal.        Cognition and Memory: Cognition and memory normal.        Judgment: Judgment normal.      Abdominal bloating and epigastric pain not worsening with palpation normoactive bowel sounds dull to percussion x 4 quads; on/off exam table sitting lying and reverse quickly     Assessment & Plan:  A-epigastric pain, abdominal bloating  P-Patient on vacation next week and clinic closed tomorrow may email me at [email or web address] or call RN Rolly Salter [phone] M, Tu, Th or Fr during clinic hours.  Trial omeprazole DR 20mg  po daily #90 RF0 discussed take for 2 weeks dispensed from [facility] to patient. Take with food at lunch or breakfast.  Discussed symptoms consistent with gastric ulcer, viral gastroenteritis circulating in community, and/or diverticulitis.   I have recommended bland diet.  Avoid dairy/spicy, fried and large portions of meat while having nausea.  If vomiting hold po intake x 1 hour.  Then sips clear fluids like broths, ginger ale, power ade, gatorade, pedialyte may advance to soft/bland if no vomiting x 24 hours and appetite returned otherwise hydration main focus Return to the clinic if symptoms persist or worsen; I have alerted the patient to call if high fever, dehydration, marked weakness, fainting, increased abdominal pain, blood in stool or vomit (red or black).   Exitcare handouts printed and given on nausea, peptic ulcer, bloating, diverticulitis, diarrhea and foods to relieve diarrhea.  Patient overdue for colonoscopy states going in June after birthday. Avoid concentrated sugars, dairy, spicy and fried foods until diarrhea resolves. Avoid dehydration drink noncaffeinated beverages (water, ginger ale, soup broth, popsicles, no sugar added gatorade/powerade) to urinate every 2-4 hours pale yellow urine.  It is easy to become dehydrated when having diarrhea along with electrolyte imbalances.  Patient to take  temperature and if less than 100.5 F may take over the counter Imodium per manufacturer's instructions. Return to the clinic if symptoms persist or worsen; I have alerted the patient to call if high fever, dehydration, marked weakness, fainting, increased abdominal pain, blood in stool or vomit.  Patient stools loose after having constipation end of last week but not watery or explosive today just softer and more frequent.  Patient verbalized agreement and understanding of treatment plan and had no further questions at this time. P2: Hand washing and fitness

## 2020-08-12 NOTE — Patient Instructions (Signed)
Nausea, Adult Nausea is the feeling that you have an upset stomach or that you are about to vomit. Nausea on its own is not usually a serious concern, but it may be an early sign of a more serious medical problem. As nausea gets worse, it can lead to vomiting. If vomiting develops, or if you are not able to drink enough fluids, you are at risk of becoming dehydrated. Dehydration can make you tired and thirsty, cause you to have a dry mouth, and decrease how often you urinate. Older adults and people with other diseases or a weak disease-fighting system (immune system) are at higher risk for dehydration. The main goals of treating your nausea are:  To relieve your nausea.  To limit repeated nausea episodes.  To prevent vomiting and dehydration. Follow these instructions at home: Watch your symptoms for any changes. Tell your health care provider about them. Follow these instructions as told by your health care provider. Eating and drinking  Take an oral rehydration solution (ORS). This is a drink that is sold at pharmacies and retail stores.  Drink clear fluids slowly and in small amounts as you are able. Clear fluids include water, ice chips, low-calorie sports drinks, and fruit juice that has water added (diluted fruit juice).  Eat bland, easy-to-digest foods in small amounts as you are able. These foods include bananas, applesauce, rice, lean meats, toast, and crackers.  Avoid drinking fluids that contain a lot of sugar or caffeine, such as energy drinks, sports drinks, and soda.  Avoid alcohol.  Avoid spicy or fatty foods.      General instructions  Take over-the-counter and prescription medicines only as told by your health care provider.  Rest at home while you recover.  Drink enough fluid to keep your urine pale yellow.  Breathe slowly and deeply when you feel nauseous.  Avoid smelling things that have strong odors.  Wash your hands often using soap and water. If soap and  water are not available, use hand sanitizer.  Make sure that all people in your household wash their hands well and often.  Keep all follow-up visits as told by your health care provider. This is important. Contact a health care provider if:  Your nausea gets worse.  Your nausea does not go away after two days.  You vomit.  You cannot drink fluids without vomiting.  You have any of the following: ? New symptoms. ? A fever. ? A headache. ? Muscle cramps. ? A rash. ? Pain while urinating.  You feel light-headed or dizzy. Get help right away if:  You have pain in your chest, neck, arm, or jaw.  You feel extremely weak or you faint.  You have vomit that is bright red or looks like coffee grounds.  You have bloody or black stools or stools that look like tar.  You have a severe headache, a stiff neck, or both.  You have severe pain, cramping, or bloating in your abdomen.  You have difficulty breathing or are breathing very quickly.  Your heart is beating very quickly.  Your skin feels cold and clammy.  You feel confused.  You have signs of dehydration, such as: ? Dark urine, very little urine, or no urine. ? Cracked lips. ? Dry mouth. ? Sunken eyes. ? Sleepiness. ? Weakness. These symptoms may represent a serious problem that is an emergency. Do not wait to see if the symptoms will go away. Get medical help right away. Call your local emergency services (  911 in the [place] Do not drive yourself to the hospital. Summary  Nausea is the feeling that you have an upset stomach or that you are about to vomit. Nausea on its own is not usually a serious concern, but it may be an early sign of a more serious medical problem.  If vomiting develops, or if you are not able to drink enough fluids, you are at risk of becoming dehydrated.  Follow recommendations for eating and drinking and take over-the-counter and prescription medicines only as told by your health care  provider.  Contact a health care provider right away if your symptoms worsen or you have new symptoms.  Keep all follow-up visits as told by your health care provider. This is important. This information is not intended to replace advice given to you by your health care provider. Make sure you discuss any questions you have with your health care provider. Document Revised: 03/13/2019 Document Reviewed: 09/20/2017 Elsevier Patient Education  2021 [employer] Peptic Ulcer  A peptic ulcer is a sore in the lining of the stomach (gastric ulcer) or the first part of the small intestine (duodenal ulcer). The ulcer causes a gradual wearing away (erosion) of the deeper tissue. What are the causes? Normally, the lining of the stomach and the small intestine protects them from the acid that digests food. The protective lining can be damaged by:  An infection caused by a type of bacteria called Helicobacter pylori or H. pylori.  Regular use of NSAIDs, such as ibuprofen or aspirin.  Rare tumors in the stomach, small intestine, or pancreas (Zollinger-Ellison syndrome). What increases the risk? The following factors may make you more likely to develop this condition:  Smoking.  Having a family history of ulcer disease.  Drinking alcohol.  Having been hospitalized in an intensive care unit (ICU). What are the signs or symptoms? Symptoms of this condition include:  Persistent burning pain in the area between the chest and the belly button. The pain may be worse on an empty stomach and at night.  Heartburn.  Nausea and vomiting.  Bloating. If the ulcer results in bleeding, it can cause:  Black, tarry stools.  Vomiting of bright red blood.  Vomiting of material that looks like coffee grounds. How is this diagnosed? This condition may be diagnosed based on:  Your medical history and a physical exam.  Various tests or procedures, such as: ? Blood tests, stool tests, or breath tests  to check for the H. pylori bacteria. ? An X-ray exam (upper gastrointestinal series) of the esophagus, stomach, and small intestine. ? Upper endoscopy. The health care provider examines the esophagus, stomach, and small intestine using a small flexible tube that has a video camera at the end. ? Biopsy. A tissue sample is removed to be examined under a microscope. How is this treated? Treatment for this condition may include:  Eliminating the cause of the ulcer, such as smoking or use of NSAIDs, and limiting alcohol and caffeine intake.  Medicines to reduce the amount of acid in your digestive tract.  Antibiotic medicines, if the ulcer is caused by an H. pylori infection.  An upper endoscopy may be used to treat a bleeding ulcer.  Surgery. This may be needed if the bleeding is severe or if the ulcer created a hole somewhere in the digestive system. Follow these instructions at home:  Do not drink alcohol if your health care provider tells you not to drink.  Do not use any products that  contain nicotine or tobacco, such as cigarettes, e-cigarettes, and chewing tobacco. If you need help quitting, ask your health care provider.  Take over-the-counter and prescription medicines only as told by your health care provider. ? Do not use over-the-counter medicines in place of prescription medicines unless your health care provider approves. ? Do not take aspirin, ibuprofen, or other NSAIDs unless your health care provider told you to do so.  Take over-the-counter and prescription medicines only as told by your health care provider.  Keep all follow-up visits as told by your health care provider. This is important. Contact a health care provider if:  Your symptoms do not improve within 7 days of starting treatment.  You have ongoing indigestion or heartburn. Get help right away if:  You have sudden, sharp, or persistent pain in your abdomen.  You have bloody or dark black, tarry  stools.  You vomit blood or material that looks like coffee grounds.  You become light-headed or you feel faint.  You become weak.  You become sweaty or clammy. Summary  A peptic ulcer is a sore in the lining of the stomach (gastric ulcer) or the first part of the small intestine (duodenal ulcer). The ulcer causes a gradual wearing away (erosion) of the deeper tissue.  Do not use any products that contain nicotine or tobacco, such as cigarettes, e-cigarettes, and chewing tobacco. If you need help quitting, ask your health care provider.  Take over-the-counter and prescription medicines only as told by your health care provider. Do not use over-the-counter medicines in place of prescription medicines unless your health care provider approves.  Contact your health care provider if you have ongoing indigestion or heartburn.  Keep all follow-up visits as told by your health care provider. This is important. This information is not intended to replace advice given to you by your health care provider. Make sure you discuss any questions you have with your health care provider. Document Revised: 10/18/2017 Document Reviewed: 10/18/2017 Elsevier Patient Education  2021 [employer] Diverticulitis  Diverticulitis is infection or inflammation of small pouches (diverticula) in the colon that form due to a condition called diverticulosis. Diverticula can trap stool (feces) and bacteria, causing infection and inflammation. Diverticulitis may cause severe stomach pain and diarrhea. It may lead to tissue damage in the colon that causes bleeding or blockage. The diverticula may also burst (rupture) and cause infected stool to enter other areas of the abdomen. What are the causes? This condition is caused by stool becoming trapped in the diverticula, which allows bacteria to grow in the diverticula. This leads to inflammation and infection. What increases the risk? You are more likely to develop this  condition if you have diverticulosis. The risk increases if you:  Are overweight or obese.  Do not get enough exercise.  Drink alcohol.  Use tobacco products.  Eat a diet that has a lot of red meat such as beef, pork, or lamb.  Eat a diet that does not include enough fiber. High-fiber foods include fruits, vegetables, beans, nuts, and whole grains.  Are over 27 years of age. What are the signs or symptoms? Symptoms of this condition may include:  Pain and tenderness in the abdomen. The pain is normally located on the left side of the abdomen, but it may occur in other areas.  Fever and chills.  Nausea.  Vomiting.  Cramping.  Bloating.  Changes in bowel routines.  Blood in your stool. How is this diagnosed? This condition is diagnosed based  on:  Your medical history.  A physical exam.  Tests to make sure there is nothing else causing your condition. These tests may include: ? Blood tests. ? Urine tests. ? CT scan of the abdomen. How is this treated? Most cases of this condition are mild and can be treated at home. Treatment may include:  Taking over-the-counter pain medicines.  Following a clear liquid diet.  Taking antibiotic medicines by mouth.  Resting. More severe cases may need to be treated at a hospital. Treatment may include:  Not eating or drinking.  Taking prescription pain medicine.  Receiving antibiotic medicines through an IV.  Receiving fluids and nutrition through an IV.  Surgery. When your condition is under control, your health care provider may recommend that you have a colonoscopy. This is an exam to look at the entire large intestine. During the exam, a lubricated, bendable tube is inserted into the anus and then passed into the rectum, colon, and other parts of the large intestine. A colonoscopy can show how severe your diverticula are and whether something else may be causing your symptoms. Follow these instructions at  home: Medicines  Take over-the-counter and prescription medicines only as told by your health care provider. These include fiber supplements, probiotics, and stool softeners.  If you were prescribed an antibiotic medicine, take it as told by your health care provider. Do not stop taking the antibiotic even if you start to feel better.  Ask your health care provider if the medicine prescribed to you requires you to avoid driving or using machinery. Eating and drinking  Follow a full liquid diet or another diet as directed by your health care provider.  After your symptoms improve, your health care provider may tell you to change your diet. He or she may recommend that you eat a diet that contains at least 25 grams (25 g) of fiber daily. Fiber makes it easier to pass stool. Healthy sources of fiber include: ? Berries. One cup contains 4-8 grams of fiber. ? Beans or lentils. One-half cup contains 5-8 grams of fiber. ? Green vegetables. One cup contains 4 grams of fiber.  Avoid eating red meat.   General instructions  Do not use any products that contain nicotine or tobacco, such as cigarettes, e-cigarettes, and chewing tobacco. If you need help quitting, ask your health care provider.  Exercise for at least 30 minutes, 3 times each week. You should exercise hard enough to raise your heart rate and break a sweat.  Keep all follow-up visits as told by your health care provider. This is important. You may need to have a colonoscopy. Contact a health care provider if:  Your pain does not improve.  Your bowel movements do not return to normal. Get help right away if:  Your pain gets worse.  Your symptoms do not get better with treatment.  Your symptoms suddenly get worse.  You have a fever.  You vomit more than one time.  You have stools that are bloody, black, or tarry. Summary  Diverticulitis is infection or inflammation of small pouches (diverticula) in the colon that form due  to a condition called diverticulosis. Diverticula can trap stool (feces) and bacteria, causing infection and inflammation.  You are at higher risk for this condition if you have diverticulosis and you eat a diet that does not include enough fiber.  Most cases of this condition are mild and can be treated at home. More severe cases may need to be treated at a  hospital.  When your condition is under control, your health care provider may recommend that you have an exam called a colonoscopy. This exam can show how severe your diverticula are and whether something else may be causing your symptoms.  Keep all follow-up visits as told by your health care provider. This is important. This information is not intended to replace advice given to you by your health care provider. Make sure you discuss any questions you have with your health care provider. Document Revised: 01/22/2019 Document Reviewed: 01/22/2019 Elsevier Patient Education  2021 [employer] Abdominal Bloating When you have abdominal bloating, your abdomen may feel full, tight, or painful. It may also look bigger than normal or swollen (distended). Common causes of abdominal bloating include:  Swallowing air.  Constipation.  Problems digesting food.  Eating too much.  Irritable bowel syndrome. This is a condition that affects the large intestine.  Lactose intolerance. This is an inability to digest lactose, a natural sugar in dairy products.  Celiac disease. This is a condition that affects the ability to digest gluten, a protein found in some grains.  Gastroparesis. This is a condition that slows down the movement of food in the stomach and small intestine. It is more common in people with diabetes mellitus.  Gastroesophageal reflux disease (GERD). This is a digestive condition that makes stomach acid flow back into the esophagus.  Urinary retention. This means that the body is holding onto urine, and the bladder cannot be  emptied all the way. Follow these instructions at home: Eating and drinking  Avoid eating too much.  Try not to swallow air while talking or eating.  Avoid eating while lying down.  Avoid these foods and drinks: ? Foods that cause gas, such as broccoli, cabbage, cauliflower, and baked beans. ? Carbonated drinks. ? Hard candy. ? Chewing gum. Medicines  Take over-the-counter and prescription medicines only as told by your health care provider.  Take probiotic medicines. These medicines contain live bacteria or yeasts that can help digestion.  Take coated peppermint oil capsules. Activity  Try to exercise regularly. Exercise may help to relieve bloating that is caused by gas and relieve constipation. General instructions  Keep all follow-up visits as told by your health care provider. This is important. Contact a health care provider if:  You have nausea and vomiting.  You have diarrhea.  You have abdominal pain.  You have unusual weight loss or weight gain.  You have severe pain, and medicines do not help. Get help right away if:  You have severe chest pain.  You have trouble breathing.  You have shortness of breath.  You have trouble urinating.  You have darker urine than normal.  You have blood in your stools or have dark, tarry stools. Summary  Abdominal bloating means that the abdomen is swollen.  Common causes of abdominal bloating are swallowing air, constipation, and problems digesting food.  Avoid eating too much and avoid swallowing air.  Avoid foods that cause gas, carbonated drinks, hard candy, and chewing gum. This information is not intended to replace advice given to you by your health care provider. Make sure you discuss any questions you have with your health care provider. Document Revised: 07/31/2018 Document Reviewed: 05/14/2016 Elsevier Patient Education  2021 [employer]. Food Choices to Help Relieve Diarrhea, Adult Diarrhea can  make you feel weak and cause you to become dehydrated. It is important to choose the right foods and drinks to:  Relieve diarrhea.  Replace lost  fluids and nutrients.  Prevent dehydration. What are tips for following this plan? Relieving diarrhea  Avoid foods that make your diarrhea worse. These may include: ? Foods and beverages sweetened with high-fructose corn syrup, honey, or sweeteners such as xylitol, sorbitol, and mannitol. ? Fried, greasy, or spicy foods. ? Raw fruits and vegetables.  Eat foods that are rich in probiotics. These include foods such as yogurt and fermented milk products. Probiotics can help increase healthy bacteria in your stomach and intestines (gastrointestinal tract or GI tract). This may help digestion and stop diarrhea.  If you have lactose intolerance, avoid dairy products. These may make your diarrhea worse.  Take medicine to help stop diarrhea only as told by your health care provider. Replacing nutrients  Eat bland, easy-to-digest foods in small amounts as you are able, until your diarrhea starts to get better. These foods include bananas, applesauce, rice, toast, and crackers.  Gradually reintroduce nutrient-rich foods as tolerated or as told by your health care provider. This includes: ? Well-cooked protein foods, such as eggs, lean meats like fish or chicken without skin, and tofu. ? Peeled, seeded, and soft-cooked fruits and vegetables. ? Low-fat dairy products. ? Whole grains.  Take vitamin and mineral supplements as told by your health care provider.   Preventing dehydration  Start by sipping water or a solution to prevent dehydration (oral rehydration solution, ORS). This is a drink that helps replace fluids and minerals your body has lost. You can buy an ORS at pharmacies and retail stores.  Try to drink at least 8-10 cups (2,000-2,500 mL) of fluid each day to help replace lost fluids. If you have urine that is pale yellow, you are getting  enough fluids.  You may drink other liquids in addition to water, such as fruit juice that you have added water to (diluted fruit juice) or low-calorie sports drinks, as tolerated or as told by your health care provider.  Avoid drinks with caffeine, such as coffee, tea, or soft drinks.  Avoid alcohol.   Summary  When you have diarrhea, it is important to choose the right foods and drinks to relieve diarrhea, to replace lost fluids and nutrients, and to prevent dehydration.  Make sure you drink enough fluid to keep your urine pale yellow.  You may benefit from eating bland foods at first. Gradually reintroduce healthy, nutrient-rich foods as tolerated or as told by your health care provider.  Avoid foods that make your diarrhea worse, such as fried, greasy, or spicy foods. This information is not intended to replace advice given to you by your health care provider. Make sure you discuss any questions you have with your health care provider. Document Revised: 05/29/2019 Document Reviewed: 05/29/2019 Elsevier Patient Education  2021 [employer] Diarrhea, Adult Diarrhea is frequent loose and watery bowel movements. Diarrhea can make you feel weak and cause you to become dehydrated. Dehydration can make you tired and thirsty, cause you to have a dry mouth, and decrease how often you urinate. Diarrhea typically lasts 2-3 days. However, it can last longer if it is a sign of something more serious. It is important to treat your diarrhea as told by your health care provider. Follow these instructions at home: Eating and drinking Follow these recommendations as told by your health care provider:  Take an oral rehydration solution (ORS). This is an over-the-counter medicine that helps return your body to its normal balance of nutrients and water. It is found at pharmacies and retail stores.  Drink  plenty of fluids, such as water, ice chips, diluted fruit juice, and low-calorie sports drinks. You  can drink milk also, if desired.  Avoid drinking fluids that contain a lot of sugar or caffeine, such as energy drinks, sports drinks, and soda.  Eat bland, easy-to-digest foods in small amounts as you are able. These foods include bananas, applesauce, rice, lean meats, toast, and crackers.  Avoid alcohol.  Avoid spicy or fatty foods.      Medicines  Take over-the-counter and prescription medicines only as told by your health care provider.  If you were prescribed an antibiotic medicine, take it as told by your health care provider. Do not stop using the antibiotic even if you start to feel better. General instructions  Wash your hands often using soap and water. If soap and water are not available, use a hand sanitizer. Others in the household should wash their hands as well. Hands should be washed: ? After using the toilet or changing a diaper. ? Before preparing, cooking, or serving food. ? While caring for a sick person or while visiting someone in a hospital.  Drink enough fluid to keep your urine pale yellow.  Rest at home while you recover.  Watch your condition for any changes.  Take a warm bath to relieve any burning or pain from frequent diarrhea episodes.  Keep all follow-up visits as told by your health care provider. This is important.   Contact a health care provider if:  You have a fever.  Your diarrhea gets worse.  You have new symptoms.  You cannot keep fluids down.  You feel light-headed or dizzy.  You have a headache.  You have muscle cramps. Get help right away if:  You have chest pain.  You feel extremely weak or you faint.  You have bloody or black stools or stools that look like tar.  You have severe pain, cramping, or bloating in your abdomen.  You have trouble breathing or you are breathing very quickly.  Your heart is beating very quickly.  Your skin feels cold and clammy.  You feel confused.  You have signs of dehydration,  such as: ? Dark urine, very little urine, or no urine. ? Cracked lips. ? Dry mouth. ? Sunken eyes. ? Sleepiness. ? Weakness. Summary  Diarrhea is frequent loose and watery bowel movements. Diarrhea can make you feel weak and cause you to become dehydrated.  Drink enough fluids to keep your urine pale yellow.  Make sure that you wash your hands after using the toilet. If soap and water are not available, use hand sanitizer.  Contact a health care provider if your diarrhea gets worse or you have new symptoms.  Get help right away if you have signs of dehydration. This information is not intended to replace advice given to you by your health care provider. Make sure you discuss any questions you have with your health care provider. Document Revised: 08/29/2018 Document Reviewed: 09/16/2017 Elsevier Patient Education  2021 [employer] [place] Diet A bland diet consists of foods that are often soft and do not have a lot of fat, fiber, or extra seasonings. Foods without fat, fiber, or seasoning are easier for the body to digest. They are also less likely to irritate your mouth, throat, stomach, and other parts of your digestive system. A bland diet is sometimes called a BRAT diet. What is my plan? Your health care provider or food and nutrition specialist (dietitian) may recommend specific changes to your diet  to prevent symptoms or to treat your symptoms. These changes may include:  Eating small meals often.  Cooking food until it is soft enough to chew easily.  Chewing your food well.  Drinking fluids slowly.  Not eating foods that are very spicy, sour, or fatty.  Not eating citrus fruits, such as oranges and grapefruit. What do I need to know about this diet?  Eat a variety of foods from the bland diet food list.  Do not follow a bland diet longer than needed.  Ask your health care provider whether you should take vitamins or supplements. What foods can I eat? Grains Hot  cereals, such as cream of wheat. Rice. Bread, crackers, or tortillas made from refined white flour.   Vegetables Canned or cooked vegetables. Mashed or boiled potatoes. Fruits Bananas. Applesauce. Other types of cooked or canned fruit with the skin and seeds removed, such as canned peaches or pears.   Meats and other proteins Scrambled eggs. Creamy peanut butter or other nut butters. Lean, well-cooked meats, such as chicken or fish. Tofu. Soups or broths.   Dairy Low-fat dairy products, such as milk, cottage cheese, or yogurt. Beverages Water. Herbal tea. Apple juice.   Fats and oils Mild salad dressings. Canola or olive oil. Sweets and desserts Pudding. Custard. Fruit gelatin. Ice cream. The items listed above may not be a complete list of recommended foods and beverages. Contact a dietitian for more options. What foods are not recommended? Grains Whole grain breads and cereals. Vegetables Raw vegetables. Fruits Raw fruits, especially citrus, berries, or dried fruits. Dairy Whole fat dairy foods. Beverages Caffeinated drinks. Alcohol. Seasonings and condiments Strongly flavored seasonings or condiments. Hot sauce. Salsa. Other foods Spicy foods. Fried foods. Sour foods, such as pickled or fermented foods. Foods with high sugar content. Foods high in fiber. The items listed above may not be a complete list of foods and beverages to avoid. Contact a dietitian for more information. Summary  A bland diet consists of foods that are often soft and do not have a lot of fat, fiber, or extra seasonings.  Foods without fat, fiber, or seasoning are easier for the body to digest.  Check with your health care provider to see how long you should follow this diet plan. It is not meant to be followed for long periods. This information is not intended to replace advice given to you by your health care provider. Make sure you discuss any questions you have with your health care  provider. Document Revised: 05/11/2017 Document Reviewed: 05/11/2017 Elsevier Patient Education  2021 [employer].

## 2020-09-17 ENCOUNTER — Encounter: Payer: Self-pay | Admitting: Registered Nurse

## 2020-09-17 ENCOUNTER — Telehealth: Payer: Self-pay | Admitting: Registered Nurse

## 2020-09-17 DIAGNOSIS — J069 Acute upper respiratory infection, unspecified: Secondary | ICD-10-CM

## 2020-09-17 NOTE — Telephone Encounter (Signed)
Notified by HR Salley Slaughter patient notified task force started to feel bad yesterday evening sore throat and body aches.  Woke up 0200 with chills, fever 99.4, headache and scratchy throat.  Went back to sleep awoke 0430 no fever or chills but still having headache, sore throat and tired.  Covid vaccines x 3 and positive covid test 05/28/20 and patient waiting for clinic staff to call him at home.  Message left for patient and he immediately returned call.  Patient did not develop symptoms of cough, trouble breathing, chest pain, nausea, vomiting, or diarrhea.  He does have home covid tests available and prefers to use them.  He has not ordered his third set of free tests from [place] govt website and assisted to order them today.  email [email or web address].  Patient took tylenol this morning and feeling better no longer having fever or chills.  Discussed that this could be flu, covid or different viral URI as all circulating in community.  Patient is eligible for 4th booster/vaccine as 3 months after positive test date he was notified.  Discussed I recommend testing on day 3 of symptoms which will be Friday.  RN Rolly Salter will check in with him daily Thursday and Friday.  HR will be notified instructed to quarantine at home and test Friday.  Discussed flu testing can be done with [facility] and patient refused at this time.  Patient lives alone.  Patient unable to work remote.  Discussed with patient there is new variant circulating for covid and research suggests previous omicron infection should be protective against new variant but he is immunocompromised and could have reinfection.  Discussed hydrating as losing more fluid with post nasal drip/fever.  Discussed restarting flonase/nasal saline/dayquil/nyquil as has worked well for him in the past for URI symptoms.  Discussed will need to have symptoms improving no fever off tylenol x 24 hours or vomiting/diarrhea 24 hours prior to returning to work and test results from Day  3 testing.  Thank You! Your order has been placed and will be shipped in #2 packages.  Here are your order confirmation numbers:  Package 1 #: [id number]  Package 2 #: [id number]  If you provided your email address, we'll send you #2 confirmation emails, #2 emails with tracking numbers (#1 for each of your packages), and delivery updates.  Is this the first or second order for this address? If so, you can place another order.   Reviewed possible Covid symptoms including cough, shortness of breath with exertion or at rest, runny nose, congestion, sinus pain/pressure, sore throat, fever/chills, body aches, fatigue, loss of taste/smell, GI symptoms of nausea/vomiting/diarrhea. Same day/emergent eval/ER precautions of dizziness/syncope, confusion, blue tint to lips/face, severe shortness of breath/difficulty breathing/wheezing.    Pt verbalized understanding and agreement with plan of care. No further questions/concerns at this time. Pt reminded to contact clinic with any changes in symptoms or questions/concerns.  HR notified patient to quarantine and testing on Friday.

## 2020-09-19 NOTE — Telephone Encounter (Signed)
No fever since yesterday afternoon. Home test this morning 5/27 and was negative. Sore throat resolved. Congestion still present but improving. Still using Dayquil/Nyquil/saline/flonase. Denies any questions concerns. Feels he will be able to RTW 5/31 as planned.

## 2020-09-20 NOTE — Telephone Encounter (Signed)
Reviewed RN Rolly Salter note symptoms improving covid test negative.  Will follow up with patient via telephone prior to 23 Sep 2020.  Workplace and clinic closed for holiday 22 Sep 2020.

## 2020-09-22 NOTE — Telephone Encounter (Signed)
Patient returned call just having normal allergy nasal congestion now and feeling well.  No fever since Thursday.  Just using nasal saline and flonase now.  Ready to return to work tomorrow. A&Ox3 spoke full sentences without difficulty; no cough/nasal sniffing or throat clearing noted during 3 minute telephone call.  Discussed strict mask wear through 09/26/20 and no eating in employee lunch room but may use outside area, meditation room by clinic or his car this week.  Cleared to return onsite 09/23/20.  HR notified.  Patient verbalized understanding information/instructions, agreed with plan of care and had no further questions at this time.

## 2020-09-23 NOTE — Telephone Encounter (Signed)
Spoke with pt by phone. He did RTW today as expected. Denies any questions or concerns. Will plan for f/u around Day 10 to close encounter.

## 2020-09-23 NOTE — Telephone Encounter (Signed)
Reviewed RN Rolly Salter note patient returned to work as expected today

## 2020-10-07 ENCOUNTER — Ambulatory Visit: Payer: Self-pay | Admitting: Registered Nurse

## 2020-10-07 ENCOUNTER — Encounter: Payer: Self-pay | Admitting: Registered Nurse

## 2020-10-07 ENCOUNTER — Other Ambulatory Visit: Payer: Self-pay

## 2020-10-07 VITALS — BP 132/90 | HR 73 | Temp 97.7°F

## 2020-10-07 DIAGNOSIS — L299 Pruritus, unspecified: Secondary | ICD-10-CM

## 2020-10-07 DIAGNOSIS — L247 Irritant contact dermatitis due to plants, except food: Secondary | ICD-10-CM

## 2020-10-07 MED ORDER — AQUAPHOR EX OINT: TOPICAL_OINTMENT | CUTANEOUS | 0 refills | Status: AC | PRN

## 2020-10-07 MED ORDER — PREDNISONE 10 MG PO TABS: ORAL_TABLET | ORAL | 0 refills | Status: AC

## 2020-10-07 NOTE — Progress Notes (Signed)
Subjective:    Patient ID: Barry Zhang, male    DOB: 03-26-1966, 55 y.o.   MRN: [id number]  55y/o Caucasian established male pt c/o poison oak/ivy rash to bilateral arms, abdomen, L foot x1 week. Is very itchy. Has used at home cortisone cream, oatmeal baths and cold compresses, calamine lotion without much relief.   Oatmeal cold compresses helped the most yesterday as arm was hot and swollen but today no longer hot and swelling down.  Itching is very bad and requesting prednisone.  Last use Apr 2021 required 2 week taper.  "I avoided the known area of the yard with poison ivy but I must have hit a new patch"     Review of Systems  Constitutional:  Negative for activity change, appetite change, chills, diaphoresis, fatigue and fever.  HENT:  Negative for trouble swallowing and voice change.   Eyes:  Negative for photophobia and visual disturbance.  Respiratory:  Negative for cough, choking, shortness of breath, wheezing and stridor.   Cardiovascular:  Negative for chest pain and palpitations.  Gastrointestinal:  Negative for diarrhea, nausea and vomiting.  Endocrine: Negative for cold intolerance and heat intolerance.  Genitourinary:  Negative for difficulty urinating.  Musculoskeletal:  Negative for gait problem, joint swelling, myalgias, neck pain and neck stiffness.  Skin:  Positive for color change and rash. Negative for pallor and wound.  Allergic/Immunologic: Positive for environmental allergies. Negative for food allergies.  Neurological:  Negative for dizziness, seizures, facial asymmetry, light-headedness, numbness and headaches.  Psychiatric/Behavioral:  Negative for agitation, confusion and sleep disturbance.       Objective:   Physical Exam Vitals and nursing note reviewed.  Constitutional:      General: He is awake. He is not in acute distress.    Appearance: Normal appearance. He is well-developed and well-groomed. He is not ill-appearing, toxic-appearing or  diaphoretic.  HENT:     Head: Normocephalic and atraumatic.     Jaw: There is normal jaw occlusion.     Salivary Glands: Right salivary gland is not diffusely enlarged. Left salivary gland is not diffusely enlarged.     Right Ear: Hearing and external ear normal.     Left Ear: Hearing and external ear normal.     Nose: Nose normal. No congestion or rhinorrhea.     Mouth/Throat:     Lips: Pink. No lesions.     Mouth: Mucous membranes are moist. No lacerations, oral lesions or angioedema.     Tongue: No lesions. Tongue does not deviate from midline.     Palate: No lesions.     Pharynx: Oropharynx is clear. Uvula midline. No oropharyngeal exudate or uvula swelling.  Eyes:     General: Lids are normal. Vision grossly intact. Gaze aligned appropriately. Allergic shiner present. No scleral icterus.       Right eye: No discharge.        Left eye: No discharge.     Extraocular Movements: Extraocular movements intact.     Conjunctiva/sclera: Conjunctivae normal.     Pupils: Pupils are equal, round, and reactive to light.  Neck:     Trachea: Trachea and phonation normal.  Cardiovascular:     Rate and Rhythm: Normal rate and regular rhythm.     Pulses: Normal pulses.          Radial pulses are 2+ on the right side and 2+ on the left side.  Pulmonary:     Effort: Pulmonary effort is normal. No respiratory distress.  Breath sounds: Normal breath sounds and air entry. No stridor or transmitted upper airway sounds. No wheezing or rhonchi.     Comments: Spoke full sentences without difficulty; no cough observed in exam room Abdominal:     General: Abdomen is flat.  Musculoskeletal:        General: Swelling, tenderness and signs of injury present. Normal range of motion.     Right shoulder: No swelling, deformity, effusion, laceration or tenderness. Normal range of motion.     Left shoulder: No swelling, deformity, effusion, laceration or tenderness. Normal range of motion.     Right elbow: No  swelling, deformity, effusion or lacerations. Normal range of motion.     Left elbow: No swelling, deformity, effusion or lacerations. Normal range of motion.     Right forearm: No swelling, edema, deformity, lacerations, tenderness or bony tenderness.     Left forearm: Swelling, edema and tenderness present. No deformity, lacerations or bony tenderness.     Right hand: No swelling, deformity, lacerations or tenderness. Normal range of motion. Normal strength.     Left hand: No swelling, deformity, lacerations or tenderness. Normal range of motion. Normal strength.     Cervical back: Normal range of motion and neck supple. No swelling, edema, deformity, erythema, signs of trauma, lacerations, rigidity, torticollis, tenderness or crepitus. No pain with movement. Normal range of motion.     Thoracic back: No swelling, edema, deformity, signs of trauma or lacerations. Normal range of motion.     Lumbar back: No swelling, edema, deformity, signs of trauma or lacerations. Normal range of motion.     Right knee: No swelling, deformity, effusion, erythema or ecchymosis.     Left knee: No swelling, deformity, effusion, erythema or ecchymosis.     Right lower leg: No swelling, lacerations or tenderness. No edema.     Left lower leg: No swelling, lacerations or tenderness. No edema.     Right ankle: No swelling, deformity, ecchymosis or lacerations. Normal range of motion.     Left ankle: No swelling, deformity, ecchymosis or lacerations. Normal range of motion.  Lymphadenopathy:     Head:     Right side of head: No submental, submandibular, tonsillar or preauricular adenopathy.     Left side of head: No submental, submandibular, tonsillar or preauricular adenopathy.     Cervical: No cervical adenopathy.     Right cervical: No superficial cervical adenopathy.    Left cervical: No superficial cervical adenopathy.  Skin:    General: Skin is warm and dry.     Capillary Refill: Capillary refill takes less  than 2 seconds.     Coloration: Skin is not ashen, cyanotic, jaundiced, mottled, pale or sallow.     Findings: Abrasion, ecchymosis, erythema and rash present. No abscess, acne, bruising, burn, signs of injury, laceration, lesion, petechiae or wound. Rash is macular and papular. Rash is not crusting, nodular, purpuric, pustular, scaling, urticarial or vesicular.     Nails: There is no clubbing.          Comments: Macular erythema worst left forearm area 10x7 (90% of surface area) coalesced with some abrasions linear and bruising noted edema trace 0-1+/4 nonpitting; scattered papules dry in macular erythema; right forearm covering 60%; patches bilateral legs less than 3x4 and left anterior chest  Neurological:     General: No focal deficit present.     Mental Status: He is alert and oriented to person, place, and time. Mental status is at baseline.  GCS: GCS eye subscore is 4. GCS verbal subscore is 5. GCS motor subscore is 6.     Cranial Nerves: No cranial nerve deficit, dysarthria or facial asymmetry.     Sensory: Sensation is intact.     Motor: Motor function is intact. No weakness, tremor, atrophy, abnormal muscle tone or seizure activity.     Coordination: Coordination is intact. Coordination normal.     Gait: Gait is intact. Gait normal.     Comments: On/off exam table and In/out of chair without difficulty; gait sure and steady in clinic; bilateral hand grasp equal 5/5  Psychiatric:        Attention and Perception: Attention and perception normal.        Mood and Affect: Mood and affect normal.        Speech: Speech normal.        Behavior: Behavior normal. Behavior is cooperative.        Thought Content: Thought content normal.        Cognition and Memory: Cognition and memory normal.        Judgment: Judgment normal.          Assessment & Plan:  A-irritant contact dermatitis due to plant and pruritis  P-last prednisone use 2021 per Epic and he required 2 day each  60/50/40/30/20/10mg  taper.  Denied recent illness/gardening/new home/self care product use. He walked through yard before rash strated and spread.   Dispensed prednisone 10mg  po taper with breakfast (60x2, 50x2, 40x2, 30x2, 20x2, 10x2days) #42 RF0 from [facility] to patient today.  Take first dose now.  Discussed possible side effects increased BP/HR/blood sugar, insomnia with patient. Recommended zyrtec generic 10mg  po BID prn itching.     Avoid hot steam showers.  Apply emollient twice a day e.g. Fragrance free vaseline or aquaphor ointment given 4 UD from clinic stock.  May apply ice/cold compress 5 minutes QID prn itching/swelling. Avoid rubbing/scratching affected areas may apply ointment/gel or ice.   Discussed have been exposed to cleaners/chemicals/allergens/irirtants that transferred from items to hands then body/clothing due to pandemic work cleaning regimens.  Shower when he finishes work/prior to bedtime.  Avoid harsh/abrasive soaps use fragrance free/sensitive like dove/cetaphil.    Medication as directed. Call or return to clinic as needed if these symptoms worsen or fail to improve as anticipated. Exitcare handout on poison ivy printed and given to patient  Follow up for re-evaluation in 48 hours if no improvement and/or worsening of rash with plan of care. Patient to have re-evaluation if purulent discharge, worsening swelling, hot to touch or rash continuing to spread despite prednisone use. Patient verbalized agreement and understanding of treatment plan and had no further questions at this time

## 2020-10-07 NOTE — Patient Instructions (Signed)
Poison Ivy Dermatitis ?Poison ivy dermatitis is inflammation of the skin that is caused by chemicals in the leaves of the poison ivy plant. The skin reaction often involves redness, swelling, blisters, and extreme itching. ?What are the causes? ?This condition is caused by a chemical (urushiol) found in the sap of the poison ivy plant. This chemical is sticky and can be easily spread to people, animals, and objects. You can get poison ivy dermatitis by: ?Having direct contact with a poison ivy plant. ?Touching animals, other people, or objects that have come in contact with poison ivy and have the chemical on them. ?What increases the risk? ?This condition is more likely to develop in people who: ?Are outdoors often in wooded or marshy areas. ?Go outdoors without wearing protective clothing, such as closed shoes, long pants, and a long-sleeved shirt. ?What are the signs or symptoms? ?Symptoms of this condition include: ?Redness of the skin. ?Extreme itching. ?A rash that often includes bumps and blisters. The rash usually appears 48 hours after exposure, if you have been exposed before. If this is the first time you have been exposed, the rash may not appear until a week after exposure. ?Swelling. This may occur if the reaction is more severe. ?Symptoms usually last for 1-2 weeks. However, the first time you develop this condition, symptoms may last 3-4 weeks. ?How is this diagnosed? ?This condition may be diagnosed based on your symptoms and a physical exam. Your health care provider may also ask you about any recent outdoor activity. ?How is this treated? ?Treatment for this condition will vary depending on how severe it is. Treatment may include: ?Hydrocortisone cream or calamine lotion to relieve itching. ?Oatmeal baths to soothe the skin. ?Medicines, such as over-the-counter antihistamine tablets. ?Oral steroid medicine, for more severe reactions. ?Follow these instructions at home: ?Medicines ?Take or apply  over-the-counter and prescription medicines only as told by your health care provider. ?Use hydrocortisone cream or calamine lotion as needed to soothe the skin and relieve itching. ?General instructions ?Do not scratch or rub your skin. ?Apply a cold, wet cloth (cold compress) to the affected areas or take baths in cool water. This will help with itching. Avoid hot baths and showers. ?Take oatmeal baths as needed. Use colloidal oatmeal. You can get this at your local pharmacy or grocery store. Follow the instructions on the packaging. ?While you have the rash, wash clothes right after you wear them. ?Keep all follow-up visits as told by your health care provider. This is important. ?How is this prevented? ? ?Learn to identify the poison ivy plant and avoid contact with the plant. This plant can be recognized by the number of leaves. Generally, poison ivy has three leaves with flowering branches on a single stem. The leaves are typically glossy, and they have jagged edges that come to a point at the front. ?If you have been exposed to poison ivy, thoroughly wash with soap and water right away. You have about 30 minutes to remove the plant resin before it will cause the rash. Be sure to wash under your fingernails, because any plant resin there will continue to spread the rash. ?When hiking or camping, wear clothes that will help you to avoid exposure on the skin. This includes long pants, a long-sleeved shirt, tall socks, and hiking boots. You can also apply preventive lotion to your skin to help limit exposure. ?If you suspect that your clothes or outdoor gear came in contact with poison ivy, rinse them off outside   with a garden hose before you bring them inside your house. ?When doing yard work or gardening, wear gloves, long sleeves, long pants, and boots. Wash your garden tools and gloves if they come in contact with poison ivy. ?If you suspect that your pet has come into contact with poison ivy, wash him or her  with pet shampoo and water. Make sure to wear gloves while washing your pet. ?Contact a health care provider if you have: ?Open sores in the rash area. ?More redness, swelling, or pain in the affected area. ?Redness that spreads beyond the rash area. ?Fluid, blood, or pus coming from the affected area. ?A fever. ?A rash over a large area of your body. ?A rash on your eyes, mouth, or genitals. ?A rash that does not improve after a few weeks. ?Get help right away if: ?Your face swells or your eyes swell shut. ?You have trouble breathing. ?You have trouble swallowing. ?These symptoms may represent a serious problem that is an emergency. Do not wait to see if the symptoms will go away. Get medical help right away. Call your local emergency services (911 in the [place] Do not drive yourself to the hospital. ?Summary ?Poison ivy dermatitis is inflammation of the skin that is caused by chemicals in the leaves of the poison ivy plant. ?Symptoms of this condition include redness, itching, a rash, and swelling. ?Do not scratch or rub your skin. ?Take or apply over-the-counter and prescription medicines only as told by your health care provider. ?This information is not intended to replace advice given to you by your health care provider. Make sure you discuss any questions you have with your health care provider. ?Document Revised: 08/04/2018 Document Reviewed: 04/07/2018 ?Elsevier Patient Education ? 2022 [employer] ? ?

## 2020-10-08 NOTE — Telephone Encounter (Signed)
Patient seen in clinic today for rash/poison ivy.  All URI symptoms resolved feeling well except for itching today.

## 2020-10-20 ENCOUNTER — Other Ambulatory Visit: Payer: Self-pay

## 2020-10-20 ENCOUNTER — Other Ambulatory Visit: Payer: PRIVATE HEALTH INSURANCE

## 2020-10-20 ENCOUNTER — Ambulatory Visit: Payer: Self-pay | Admitting: *Deleted

## 2020-10-20 VITALS — BP 122/93 | Ht 69.0 in | Wt 216.0 lb

## 2020-10-20 DIAGNOSIS — B20 Human immunodeficiency virus [HIV] disease: Secondary | ICD-10-CM

## 2020-10-20 DIAGNOSIS — Z113 Encounter for screening for infections with a predominantly sexual mode of transmission: Secondary | ICD-10-CM

## 2020-10-20 DIAGNOSIS — Z79899 Other long term (current) drug therapy: Secondary | ICD-10-CM

## 2020-10-20 DIAGNOSIS — Z0000 Encounter for general adult medical examination without abnormal findings: Secondary | ICD-10-CM

## 2020-10-20 NOTE — Progress Notes (Signed)
[employer] insurance premium discount evaluation: Labs Drawn. Replacements ROI form signed. Tobacco Free Attestation form signed.  Forms placed in paper chart.  

## 2020-10-21 LAB — CMP12+LP+TP+TSH+6AC+PSA+CBC…
ALT: 33 IU/L (ref 0–44)
AST: 35 IU/L (ref 0–40)
Albumin/Globulin Ratio: 1.9 (ref 1.2–2.2)
Albumin: 4.1 g/dL (ref 3.8–4.9)
Alkaline Phosphatase: 73 IU/L (ref 44–121)
BUN/Creatinine Ratio: 14 (ref 9–20)
BUN: 17 mg/dL (ref 6–24)
Basophils Absolute: 0 10*3/uL (ref 0.0–0.2)
Basos: 1 %
Bilirubin Total: 0.6 mg/dL (ref 0.0–1.2)
Calcium: 9 mg/dL (ref 8.7–10.2)
Chloride: 107 mmol/L — ABNORMAL HIGH (ref 96–106)
Chol/HDL Ratio: 5.2 ratio — ABNORMAL HIGH (ref 0.0–5.0)
Cholesterol, Total: 214 mg/dL — ABNORMAL HIGH (ref 100–199)
Creatinine, Ser: 1.23 mg/dL (ref 0.76–1.27)
EOS (ABSOLUTE): 0.1 10*3/uL (ref 0.0–0.4)
Eos: 2 %
Estimated CHD Risk: 1.1 times avg. — ABNORMAL HIGH (ref 0.0–1.0)
Free Thyroxine Index: 2.2 (ref 1.2–4.9)
GGT: 9 IU/L (ref 0–65)
Globulin, Total: 2.2 g/dL (ref 1.5–4.5)
Glucose: 96 mg/dL (ref 65–99)
HDL: 41 mg/dL (ref >39)
Hematocrit: 49.8 % (ref 37.5–51.0)
Hemoglobin: 16.6 g/dL (ref 13.0–17.7)
Immature Grans (Abs): 0 10*3/uL (ref 0.0–0.1)
Immature Granulocytes: 0 %
Iron: 135 ug/dL (ref 38–169)
LDH: 245 IU/L — ABNORMAL HIGH (ref 121–224)
LDL Chol Calc (NIH): 146 mg/dL — ABNORMAL HIGH (ref 0–99)
Lymphocytes Absolute: 1.1 10*3/uL (ref 0.7–3.1)
Lymphs: 25 %
MCH: 31.4 pg (ref 26.6–33.0)
MCHC: 33.3 g/dL (ref 31.5–35.7)
MCV: 94 fL (ref 79–97)
Monocytes Absolute: 0.5 10*3/uL (ref 0.1–0.9)
Monocytes: 12 %
Neutrophils Absolute: 2.7 10*3/uL (ref 1.4–7.0)
Neutrophils: 60 %
Phosphorus: 3.1 mg/dL (ref 2.8–4.1)
Platelets: 113 10*3/uL — ABNORMAL LOW (ref 150–450)
Potassium: 4.4 mmol/L (ref 3.5–5.2)
Prostate Specific Ag, Serum: 1 ng/mL (ref 0.0–4.0)
RBC: 5.28 x10E6/uL (ref 4.14–5.80)
RDW: 12.4 % (ref 11.6–15.4)
Sodium: 142 mmol/L (ref 134–144)
T3 Uptake Ratio: 26 % (ref 24–39)
T4, Total: 8.3 ug/dL (ref 4.5–12.0)
TSH: 1.39 u[IU]/mL (ref 0.450–4.500)
Total Protein: 6.3 g/dL (ref 6.0–8.5)
Triglycerides: 150 mg/dL — ABNORMAL HIGH (ref 0–149)
Uric Acid: 6.9 mg/dL (ref 3.8–8.4)
VLDL Cholesterol Cal: 27 mg/dL (ref 5–40)
WBC: 4.5 10*3/uL (ref 3.4–10.8)
eGFR: 69 mL/min/{1.73_m2} (ref >59)

## 2020-10-21 LAB — T-HELPER CELL (CD4) - (RCID CLINIC ONLY)
CD4 % Helper T Cell: 37 % (ref 33–65)
CD4 T Cell Abs: 383 /uL — ABNORMAL LOW (ref 400–1790)

## 2020-10-21 LAB — HGB A1C W/O EAG: Hgb A1c MFr Bld: 5.1 % (ref 4.8–5.6)

## 2020-10-22 LAB — CBC
HCT: 50.5 % — ABNORMAL HIGH (ref 38.5–50.0)
Hemoglobin: 17.1 g/dL (ref 13.2–17.1)
MCH: 32.3 pg (ref 27.0–33.0)
MCHC: 33.9 g/dL (ref 32.0–36.0)
MCV: 95.3 fL (ref 80.0–100.0)
MPV: 12.3 fL (ref 7.5–12.5)
Platelets: 120 10*3/uL — ABNORMAL LOW (ref 140–400)
RBC: 5.3 10*6/uL (ref 4.20–5.80)
RDW: 12.3 % (ref 11.0–15.0)
WBC: 4.8 10*3/uL (ref 3.8–10.8)

## 2020-10-22 LAB — COMPREHENSIVE METABOLIC PANEL
AG Ratio: 1.7 (calc) (ref 1.0–2.5)
ALT: 32 U/L (ref 9–46)
AST: 30 U/L (ref 10–35)
Albumin: 4 g/dL (ref 3.6–5.1)
Alkaline phosphatase (APISO): 71 U/L (ref 35–144)
BUN: 23 mg/dL (ref 7–25)
CO2: 25 mmol/L (ref 20–32)
Calcium: 9 mg/dL (ref 8.6–10.3)
Chloride: 105 mmol/L (ref 98–110)
Creat: 1.21 mg/dL (ref 0.70–1.33)
Globulin: 2.3 g/dL (calc) (ref 1.9–3.7)
Glucose, Bld: 90 mg/dL (ref 65–99)
Potassium: 4.5 mmol/L (ref 3.5–5.3)
Sodium: 140 mmol/L (ref 135–146)
Total Bilirubin: 0.6 mg/dL (ref 0.2–1.2)
Total Protein: 6.3 g/dL (ref 6.1–8.1)

## 2020-10-22 LAB — LIPID PANEL
Cholesterol: 211 mg/dL — ABNORMAL HIGH (ref <200)
HDL: 37 mg/dL — ABNORMAL LOW (ref >=40)
Non-HDL Cholesterol (Calc): 174 mg/dL (calc) — ABNORMAL HIGH (ref <130)
Total CHOL/HDL Ratio: 5.7 (calc) — ABNORMAL HIGH (ref <5.0)
Triglycerides: 430 mg/dL — ABNORMAL HIGH (ref <150)

## 2020-10-22 LAB — HIV-1 RNA QUANT-NO REFLEX-BLD
HIV 1 RNA Quant: NOT DETECTED Copies/mL
HIV-1 RNA Quant, Log: NOT DETECTED Log cps/mL

## 2020-10-22 LAB — RPR: RPR Ser Ql: REACTIVE — AB

## 2020-10-22 LAB — RPR TITER: RPR Titer: 1:1 {titer} — ABNORMAL HIGH

## 2020-10-22 LAB — FLUORESCENT TREPONEMAL AB(FTA)-IGG-BLD: Fluorescent Treponemal ABS: REACTIVE — AB

## 2020-11-04 ENCOUNTER — Encounter: Payer: Self-pay | Admitting: Infectious Diseases

## 2020-11-04 ENCOUNTER — Ambulatory Visit (INDEPENDENT_AMBULATORY_CARE_PROVIDER_SITE_OTHER): Payer: No Typology Code available for payment source | Admitting: Infectious Diseases

## 2020-11-04 ENCOUNTER — Other Ambulatory Visit (HOSPITAL_COMMUNITY): Payer: Self-pay

## 2020-11-04 ENCOUNTER — Other Ambulatory Visit: Payer: Self-pay

## 2020-11-04 VITALS — Temp 98.6°F | Wt 218.2 lb

## 2020-11-04 DIAGNOSIS — Z113 Encounter for screening for infections with a predominantly sexual mode of transmission: Secondary | ICD-10-CM

## 2020-11-04 DIAGNOSIS — Z79899 Other long term (current) drug therapy: Secondary | ICD-10-CM | POA: Diagnosis not present

## 2020-11-04 DIAGNOSIS — B20 Human immunodeficiency virus [HIV] disease: Secondary | ICD-10-CM

## 2020-11-04 DIAGNOSIS — E782 Mixed hyperlipidemia: Secondary | ICD-10-CM | POA: Diagnosis not present

## 2020-11-04 DIAGNOSIS — E785 Hyperlipidemia, unspecified: Secondary | ICD-10-CM | POA: Insufficient documentation

## 2020-11-04 MED ORDER — ZOSTER VAC RECOMB ADJUVANTED 50 MCG/0.5ML IM SUSR
INTRAMUSCULAR | 1 refills | Status: DC
  Filled 2020-11-04: qty 0.5, 1d supply, fill #0
  Filled 2021-01-12 (×2): qty 0.5, 1d supply, fill #1

## 2020-11-04 NOTE — Assessment & Plan Note (Signed)
We discsused his ASCVD score. He does not want statin.

## 2020-11-04 NOTE — Progress Notes (Signed)
Subjective:    Patient ID: Barry Zhang, male  DOB: [birth year], 55 y.o.        MRN: [id number]   HPI 55 yo M with hx of HIV+. Previously refused colonoscopy (still pushing to have at 55). He has been on dovato (07-2017 change from atripla started 09-2011). No problems with this.  He was seen 02-2018 with major depressio.   Worried about his recent labs. Had blood work done which showed low PLT. Has not had si/sx of bleeding. Has had 2 rounds of steroids, has taken Nyquil.  CD4 dropped some, und.   HIV 1 RNA Quant  Date Value  10/20/2020 Not Detected Copies/mL  01/22/2020 <20 Copies/mL  10/17/2018 <20 NOT DETECTED copies/mL   CD4 T Cell Abs (/uL)  Date Value  10/20/2020 383 (L)  01/22/2020 488  10/17/2018 429   The 10-year ASCVD risk score Denman George DC Jr., et al., 2013) is: 7.1%   Values used to calculate the score:     Age: 50 years     Sex: Male     Is Non-Hispanic African American: No     Diabetic: No     Tobacco smoker: No     Systolic Blood Pressure: 122 mmHg     Is BP treated: No     HDL Cholesterol: 37 mg/dL     Total Cholesterol: 211 mg/dL   Health Maintenance  Topic Date Due  . Zoster Vaccines- Shingrix (1 of 2) Never done  . COLONOSCOPY (Pts 45-22yrs Insurance coverage will need to be confirmed)  Never done  . COVID-19 Vaccine (3 - Pfizer risk series) 09/11/2019  . INFLUENZA VACCINE  11/24/2020  . TETANUS/TDAP  11/02/2025  . Pneumococcal Vaccine 74-65 Years old (4 - PPSV23 or PCV20) 10/03/2030  . Hepatitis C Screening  Completed  . HIV Screening  Completed  . HPV VACCINES  Aged Out    Review of Systems  Constitutional:  Negative for chills and fever.  Respiratory:  Negative for shortness of breath.   Cardiovascular:  Negative for chest pain.  Gastrointestinal:  Negative for constipation and diarrhea.  Genitourinary:  Negative for dysuria.  Neurological:  Negative for headaches.  Has noted that cuts and scratches have taken longer to heal, leaves  scars.  Please see HPI. All other systems reviewed and negative.     Objective:  Physical Exam Constitutional:      Appearance: He is not ill-appearing.  HENT:     Mouth/Throat:     Mouth: Mucous membranes are moist.     Pharynx: No oropharyngeal exudate.  Eyes:     Extraocular Movements: Extraocular movements intact.     Pupils: Pupils are equal, round, and reactive to light.  Cardiovascular:     Rate and Rhythm: Normal rate and regular rhythm.  Pulmonary:     Effort: Pulmonary effort is normal.     Breath sounds: Normal breath sounds.  Abdominal:     General: Bowel sounds are normal. There is no distension.     Palpations: Abdomen is soft.     Tenderness: There is no abdominal tenderness.  Musculoskeletal:        General: Normal range of motion.     Cervical back: Normal range of motion and neck supple.     Right lower leg: No edema.     Left lower leg: No edema.  Neurological:     General: No focal deficit present.     Mental Status: He is alert.  Psychiatric:  Mood and Affect: Mood normal.           Assessment & Plan:

## 2020-11-04 NOTE — Assessment & Plan Note (Signed)
interested in cabaneuva Refer for colon shingles vax.  Numbers are good We reviewed.  rtc in 9 months.

## 2020-11-04 NOTE — Addendum Note (Signed)
Addended by: Maximino Cozzolino C on: 11/04/2020 04:26 PM   Modules accepted: Orders

## 2020-11-05 ENCOUNTER — Other Ambulatory Visit (HOSPITAL_COMMUNITY): Payer: Self-pay

## 2020-11-05 ENCOUNTER — Telehealth: Payer: Self-pay

## 2020-11-05 NOTE — Telephone Encounter (Signed)
[facility] Patient Advocate Encounter  Barry Zhang is covered under patient medical benefits . Patient do not have an office visit, the Admin Fee is 80% after the deductible which is $5000.  I enrolled the patient in [employer] .         Clearance Coots, CPhT Specialty Pharmacy Patient [facility] Phone: [phone] Fax:  [phone]

## 2020-11-25 ENCOUNTER — Other Ambulatory Visit (HOSPITAL_COMMUNITY): Payer: Self-pay

## 2020-12-03 ENCOUNTER — Telehealth: Payer: Self-pay | Admitting: Pharmacist

## 2020-12-03 NOTE — Telephone Encounter (Signed)
Called patient to discuss starting Cabenuva and get him scheduled. No answer, left HIPAA compliant VM.

## 2020-12-04 ENCOUNTER — Encounter: Payer: Self-pay | Admitting: *Deleted

## 2020-12-04 ENCOUNTER — Other Ambulatory Visit: Payer: Self-pay | Admitting: *Deleted

## 2020-12-04 DIAGNOSIS — Z91030 Bee allergy status: Secondary | ICD-10-CM

## 2020-12-04 MED ORDER — EPINEPHRINE 0.3 MG/0.3ML IJ SOAJ: 0.3000 mg | INTRAMUSCULAR | 1 refills | Status: AC | PRN

## 2020-12-04 NOTE — Telephone Encounter (Signed)
RN Rolly Salter discussed patient epi pen refill request with me.  Rx signed for replacements generic epipen.  Patient uses prn bee stings.

## 2020-12-04 NOTE — Telephone Encounter (Signed)
Pt noted that his Epi pens expired in May 2020. Requests refill.

## 2020-12-04 NOTE — Addendum Note (Signed)
Addended by: Albina Billet A on: 12/04/2020 03:04 PM   Modules accepted: Level of Service

## 2020-12-17 ENCOUNTER — Other Ambulatory Visit: Payer: Self-pay

## 2021-01-07 ENCOUNTER — Encounter: Payer: Self-pay | Admitting: Registered Nurse

## 2021-01-07 ENCOUNTER — Telehealth: Payer: Self-pay | Admitting: Registered Nurse

## 2021-01-07 MED ORDER — CETIRIZINE HCL 10 MG PO TABS: 10.0000 mg | ORAL_TABLET | Freq: Two times a day (BID) | ORAL | 11 refills | Status: DC | PRN

## 2021-01-07 MED ORDER — SALINE SPRAY 0.65 % NA SOLN: 2.0000 | NASAL | Status: DC | PRN

## 2021-01-07 MED ORDER — FLUTICASONE PROPIONATE 50 MCG/ACT NA SUSP: 1.0000 | Freq: Two times a day (BID) | NASAL | 0 refills | Status: DC

## 2021-01-07 NOTE — Telephone Encounter (Signed)
HR Replacements Tonya notified NP that pt reported symptoms 01/06/21 Day 0 cough, sore throat, headache, feeling drained and congestion.  Denied known sick contacts.   Pt began quarantine at that time. Patient did not develop symptoms of  trouble breathing, chest pain, nausea, vomiting, diarrhea, body aches, fever or chills.   Covid testing today and repeat in 48 hours if negative.  5 day quarantine per [facility] recommendations. Day 1 of quarantine was 01/07/21. Patient has had primary series for covid 2021 per Epic/HR database but patient reported 1 booster and felt really ill so never received second booster.  Notified patient to email copy of booster covid vaccine card to HR.  Reviewed possible Covid symptoms including cough, shortness of breath with exertion or at rest, runny nose, congestion, sinus pain/pressure, sore throat, fever/chills, body aches, fatigue, loss of taste/smell, GI symptoms of nausea/vomiting/diarrhea. Last covid like illness May 2022 covid test negative.  Patient to isolate in own room and if possible use only one bathroom if living with others in home.  Wear mask when out of room to help prevent spread to others in household.  Sanitize high touch surfaces with lysol/chlorox/bleach spray or wipes daily as viruses are known to live on surfaces from 24 hours to days.  Patient has dayquil/nyquil at home for cough/cold use and is working for him. May use flonase nasal 1 spray each nostril BID prn rhinitis.  Dayquil and nyquil per manufacturer instructions.  Discussed honey 1 tablespoon every 4 hours is a natural cough suppressant.  He is also planning to drink tea today.  Avoid dehydration and drink water to keep urine pale yellow clear and voiding every 2-4 hours while awake.  Patient alert and oriented x3, spoke full sentences without difficulty.  Some nasal congestion noted.  Occasional wet productive cough and throat clearing audible during 6 minute telephone call.  Discussed with  patient he can contact me at number [phone] until Rolly Salter returns on Monday 9/19.  He will covid test this afternoon/evening and contact me with results.  Pt verbalized understanding and agreement with plan of care. No further questions/concerns at this time. Pt reminded to contact clinic with any changes in symptoms or questions/concerns. HR notified patient to start quarantine and covid test this afternoon if negative repeat test in 48 hours and work remote/quarantine continue.  If 2 negative covid tests and symptoms improving may return to work with strict mask use through day 10 9/23 and no eating in employee lunch room.

## 2021-01-08 ENCOUNTER — Other Ambulatory Visit (HOSPITAL_COMMUNITY): Payer: Self-pay

## 2021-01-08 NOTE — Telephone Encounter (Signed)
Patient contacted NP via telephone and stated home covid test negative and OTC flonase/nasal saline/dayquil/nyquil helping his symptoms to decrease.  Will continue quarantine and retest in 48 hours.  HR notified. Patient spoke full sentences without difficulty nasal congestion noted no cough/throat clearing during 2 minute telephone call.   Patient verbalized understanding information/instructions, agreed with plan of care and had no further questions at this time.

## 2021-01-10 NOTE — Telephone Encounter (Signed)
Patient returned call stated still some congestion but feeling better.  Second home covid test negative.  Checking his temperature every 4 hours and no fever.  Discussed may return to work Monday 9/19 but no eating in employee lunch room until symptoms resolved.  Patient A&Ox3 spoke full sentences without difficulty no cough/throat clearing or nasal congestion audible during 3 minute telephone call.  HR notified patient cleared to return onsite 9/19 but no eating in employee lunch room until symptoms resolved.

## 2021-01-12 ENCOUNTER — Other Ambulatory Visit (HOSPITAL_COMMUNITY): Payer: Self-pay

## 2021-01-12 NOTE — Telephone Encounter (Signed)
Noted returned to work as expected onsite no concerns.

## 2021-01-12 NOTE — Telephone Encounter (Signed)
Pt did RTW onsite today as expected. Closing encounter.

## 2021-01-15 ENCOUNTER — Other Ambulatory Visit (HOSPITAL_COMMUNITY): Payer: Self-pay

## 2021-01-31 NOTE — Telephone Encounter (Signed)
Telephone message left for patient to ensure last of symptoms resolved and to contact me at [phone]

## 2021-02-23 ENCOUNTER — Other Ambulatory Visit: Payer: Self-pay | Admitting: Infectious Diseases

## 2021-02-23 DIAGNOSIS — B20 Human immunodeficiency virus [HIV] disease: Secondary | ICD-10-CM

## 2021-03-16 ENCOUNTER — Telehealth: Payer: Self-pay | Admitting: *Deleted

## 2021-03-16 ENCOUNTER — Encounter: Payer: Self-pay | Admitting: *Deleted

## 2021-03-16 NOTE — Telephone Encounter (Signed)
Pt called out of work today citing stomach issues.  Diarrhea that he woke up with early this morning. Two episodes. Has since been able to tolerate ginger ale, rice, and crackers with no recurrence of sx.   Had greasy pizza yesterday that is out of his normal diet habits and believes this is the source of his sx.  Cleared to RTW tomorrow 11/22 as long as no recurrence of sx or new sx before then. Will follow normal call out procedures if needed tomorrow.

## 2021-03-16 NOTE — Telephone Encounter (Signed)
Reviewed RN Rolly Salter note agreed with plan of care and will follow up with patient 03/17/21.

## 2021-03-17 NOTE — Telephone Encounter (Signed)
Spoke with pt. He did RTW today as expected. No recurrent sx. Feels well. Closing encounter.

## 2021-03-18 NOTE — Telephone Encounter (Signed)
Noted patient returned to work and all symptoms resolved encounter closed.

## 2021-04-20 ENCOUNTER — Encounter: Payer: Self-pay | Admitting: *Deleted

## 2021-04-20 ENCOUNTER — Telehealth: Payer: Self-pay | Admitting: *Deleted

## 2021-04-20 DIAGNOSIS — Z20822 Contact with and (suspected) exposure to covid-19: Secondary | ICD-10-CM

## 2021-04-20 NOTE — Telephone Encounter (Signed)
Pt reports exposure to someone covid positive. Exposure Saturday 12/24. He began isolating when notified Saturday. He then had sx start early this morning 12/26, that woke him up around 0345. Reports head/nasal congestion, mild cough, headache.  Advised covid home test today. Then if negative repeat in 48hrs. Will f/u tomorrow 12/27 for results and sx check in. Will be out of work thru at least 12/28 awaiting repeat testing. HR made aware of same. Pt denies any questions or concerns about plan of care and declines assistance with home management of sx at this time.

## 2021-04-20 NOTE — Telephone Encounter (Signed)
Reviewed RN Haley note agreed with plan of care. 

## 2021-04-21 NOTE — Telephone Encounter (Signed)
Spoke with pt by phone. Nasal congestion is only remaining sx. HA and cough resolved. Negative home covid test yesterday 12/26. Awaiting 48hr repeat testing tomorrow, 12/28. If negative, plan for RTW 12/29. Pt agreeable and denies further questions or concerns.

## 2021-04-21 NOTE — Telephone Encounter (Signed)
Reviewed RN Rolly Salter note agreed with plan of care initial covid test negative and symptoms improving.

## 2021-04-22 NOTE — Telephone Encounter (Signed)
Spoke with patient via telephone. A&Ox3 some nasal congestion audible no cough/throat clearing.  Respirations even and unlabored.   Covid home test negative today.  Still some mild nasal congestion and runny nose using OTC cold medication and it is helping.  Denied new symptoms today and current symptoms improved.  2 negative home covid tests 48 hours apart patient cleared to return onsite tomorrow 12/29 with mask wear until no longer symptomatic to help prevent viral URI spread to coworkers.  HR notified.  Patient verbalized understanding information/agreed with plan of care and had no further questions at this time.

## 2021-04-23 NOTE — Telephone Encounter (Signed)
Spoke with pt by phone. He reports mild congestion still that he expects to linger but it is not too bothersome "just know it's there." Did RTW today as expected. Closing encounter.

## 2021-04-24 NOTE — Telephone Encounter (Signed)
Continue to monitor for covid symptoms x 10 days initial exposure 12/24  Day 10 28 Apr 2021.  Mask wear at work while having symptoms to help prevent spread of viral illnesses to coworkers.  Stay home if new symptoms develop and complete new covid test.

## 2021-04-24 NOTE — Telephone Encounter (Signed)
Reviewed RN Rolly Salter note still having some congestion and RTW as expected.

## 2021-04-28 NOTE — Telephone Encounter (Signed)
Reviewed RN Haley note patient feeling well no concerns encounter closed. 

## 2021-04-28 NOTE — Telephone Encounter (Signed)
Day 10 f/u. Pt feels well. All sx fully resolved. Closing encounter.

## 2021-05-28 ENCOUNTER — Other Ambulatory Visit: Payer: Self-pay

## 2021-05-28 ENCOUNTER — Other Ambulatory Visit (HOSPITAL_COMMUNITY): Payer: Self-pay

## 2021-05-28 DIAGNOSIS — B20 Human immunodeficiency virus [HIV] disease: Secondary | ICD-10-CM

## 2021-05-28 MED ORDER — DOVATO 50-300 MG PO TABS: 1.0000 | ORAL_TABLET | Freq: Every day | ORAL | 0 refills | Status: DC

## 2021-05-29 ENCOUNTER — Other Ambulatory Visit (HOSPITAL_COMMUNITY): Payer: Self-pay

## 2021-06-02 NOTE — Progress Notes (Unsigned)
Received fax from [employer] Dovato has been aprroved from 06/02/2021-06/01/2022.  Caci Orren Lesli Albee, CMA

## 2021-06-04 ENCOUNTER — Other Ambulatory Visit (HOSPITAL_COMMUNITY): Payer: Self-pay

## 2021-09-08 ENCOUNTER — Ambulatory Visit: Payer: No Typology Code available for payment source | Admitting: Podiatry

## 2021-09-08 ENCOUNTER — Ambulatory Visit (INDEPENDENT_AMBULATORY_CARE_PROVIDER_SITE_OTHER): Payer: No Typology Code available for payment source

## 2021-09-08 ENCOUNTER — Other Ambulatory Visit: Payer: Self-pay | Admitting: Podiatry

## 2021-09-08 ENCOUNTER — Encounter: Payer: Self-pay | Admitting: Podiatry

## 2021-09-08 ENCOUNTER — Other Ambulatory Visit: Payer: Self-pay | Admitting: Infectious Diseases

## 2021-09-08 DIAGNOSIS — M778 Other enthesopathies, not elsewhere classified: Secondary | ICD-10-CM

## 2021-09-08 DIAGNOSIS — M722 Plantar fascial fibromatosis: Secondary | ICD-10-CM | POA: Diagnosis not present

## 2021-09-08 DIAGNOSIS — B20 Human immunodeficiency virus [HIV] disease: Secondary | ICD-10-CM

## 2021-09-08 MED ORDER — METHYLPREDNISOLONE 4 MG PO TBPK: ORAL_TABLET | ORAL | 0 refills | Status: DC

## 2021-09-08 MED ORDER — TRIAMCINOLONE ACETONIDE 40 MG/ML IJ SUSP
20.0000 mg | Freq: Once | INTRAMUSCULAR | Status: AC
  Administered 2021-09-08: 20 mg

## 2021-09-08 MED ORDER — MELOXICAM 15 MG PO TABS: 15.0000 mg | ORAL_TABLET | Freq: Every day | ORAL | 3 refills | Status: DC

## 2021-09-08 NOTE — Progress Notes (Signed)
?Subjective:  ?Patient ID: Barry Zhang, male    DOB: 11/17/1965,  MRN: [id number] ?HPI ?Chief Complaint  ?Patient presents with  ? Foot Pain  ?  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 New Patient (Initial Visit)  ? ? ?56 y.o. male presents with the above complaint.  ? ?ROS: Denies fever chills nausea vomiting muscle aches pains calf pain back pain chest pain shortness of breath. ? ?Past Medical History:  ?Diagnosis Date  ? HIV (human immunodeficiency virus infection) (HCC)   ? ?No past surgical history on file. ? ?Current Outpatient Medications:  ?  meloxicam (MOBIC) 15 MG tablet, Take 1 tablet (15 mg total) by mouth daily., Disp: 30 tablet, Rfl: 3 ?  methylPREDNISolone (MEDROL DOSEPAK) 4 MG TBPK tablet, 6 day dose pack - take as directed, Disp: 21 tablet, Rfl: 0 ?  cetirizine (ZYRTEC) 10 MG tablet, Take 1 tablet (10 mg total) by mouth 2 (two) times daily as needed for up to 14 days for allergies., Disp: 60 tablet, Rfl: 11 ?  DOVATO 50-300 MG tablet, TAKE 1 TABLET BY MOUTH DAILY, Disp: 30 tablet, Rfl: 0 ?  EPINEPHrine (EPIPEN) 0.3 mg/0.3 mL IJ SOAJ injection, Inject 0.3 mg into the muscle as needed., Disp: 1 each, Rfl: 1 ?  Zoster Vaccine Adjuvanted [facility]) injection, Inject into the muscle., Disp: 0.5 mL, Rfl: 1 ? ?Allergies  ?Allergen Reactions  ? Bee Venom   ? ?Review of Systems ?Objective:  ?There were no vitals filed for this visit. ? ?General: Well developed, nourished, in no acute distress, alert and oriented x3  ? ?Dermatological: Skin is warm, dry and supple bilateral. Nails x 10 are well maintained; remaining integument appears unremarkable at this time. There are no open sores, no preulcerative lesions, no rash or signs of infection present. ? ?Vascular: Dorsalis Pedis artery and Posterior Tibial artery pedal pulses are 2/4 bilateral with immedate capillary fill time. Pedal hair  growth present. No varicosities and no lower extremity edema present bilateral.  ? ?Neruologic: Grossly intact via light touch bilateral. Vibratory intact via tuning fork bilateral. Protective threshold with Semmes Wienstein monofilament intact to all pedal sites bilateral. Patellar and Achilles deep tendon reflexes 2+ bilateral. No Babinski or clonus noted bilateral.  ? ?Musculoskeletal: No gross boney pedal deformities bilateral. No pain, crepitus, or limitation noted with foot and ankle range of motion bilateral. Muscular strength 5/5 in all groups tested bilateral.  Severe pain on palpation medial calcaneal tubercle of the right heel.  Mild erythema is present.  No pain on medial-lateral compression of the calcaneus. ? ?Gait: Unassisted, Nonantalgic.  ? ? ?Radiographs: ? 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  Also demonstrates large plantar distally oriented calcaneal spur with significant soft tissue swelling around the spur.  This is consistent with plantar fasciitis. ? ?Assessment & Plan:  ? ?Assessment: Planter fasciitis right. ? ?Plan: Discussed etiology pathology conservative or surgical therapies.  At this point I injected the right heel start her Medrol Dosepak to be followed by meloxicam.  Treatment plantar fascial brace dispensed a night splint but he gave it back states that he was claustrophobia would not allow him to wear that.  We did discuss appropriate shoe gear stretching exercises ice therapy and shoe gear modification.  With him in 1 month ? ? ? ? ?  Garrel Ridgel, DPM ?

## 2021-09-25 ENCOUNTER — Ambulatory Visit: Payer: Self-pay | Admitting: *Deleted

## 2021-09-25 VITALS — BP 126/89 | HR 74 | Ht 69.0 in | Wt 220.0 lb

## 2021-09-25 DIAGNOSIS — Z0000 Encounter for general adult medical examination without abnormal findings: Secondary | ICD-10-CM

## 2021-09-25 DIAGNOSIS — E559 Vitamin D deficiency, unspecified: Secondary | ICD-10-CM

## 2021-09-25 NOTE — Progress Notes (Signed)
[employer] insurance premium discount evaluation: Labs Drawn. Replacements ROI form signed. Tobacco Free Attestation form signed.  Forms placed in paper chart.  

## 2021-09-26 ENCOUNTER — Encounter: Payer: Self-pay | Admitting: Registered Nurse

## 2021-09-26 LAB — CMP12+LP+TP+TSH+6AC+CBC/D/PLT
ALT: 29 IU/L (ref 0–44)
AST: 17 IU/L (ref 0–40)
Albumin/Globulin Ratio: 2.2 (ref 1.2–2.2)
Albumin: 4.3 g/dL (ref 3.8–4.9)
Alkaline Phosphatase: 72 IU/L (ref 44–121)
BUN/Creatinine Ratio: 21 — ABNORMAL HIGH (ref 9–20)
BUN: 23 mg/dL (ref 6–24)
Basophils Absolute: 0 10*3/uL (ref 0.0–0.2)
Basos: 1 %
Bilirubin Total: 0.6 mg/dL (ref 0.0–1.2)
Calcium: 9.1 mg/dL (ref 8.7–10.2)
Chloride: 108 mmol/L — ABNORMAL HIGH (ref 96–106)
Chol/HDL Ratio: 4.1 ratio (ref 0.0–5.0)
Cholesterol, Total: 192 mg/dL (ref 100–199)
Creatinine, Ser: 1.09 mg/dL (ref 0.76–1.27)
EOS (ABSOLUTE): 0.1 10*3/uL (ref 0.0–0.4)
Eos: 2 %
Estimated CHD Risk: 0.8 times avg. (ref 0.0–1.0)
Free Thyroxine Index: 2.2 (ref 1.2–4.9)
GGT: 9 IU/L (ref 0–65)
Globulin, Total: 2 g/dL (ref 1.5–4.5)
Glucose: 100 mg/dL — ABNORMAL HIGH (ref 70–99)
HDL: 47 mg/dL (ref >39)
Hematocrit: 46.6 % (ref 37.5–51.0)
Hemoglobin: 16.2 g/dL (ref 13.0–17.7)
Immature Grans (Abs): 0 10*3/uL (ref 0.0–0.1)
Immature Granulocytes: 0 %
Iron: 132 ug/dL (ref 38–169)
LDH: 240 IU/L — ABNORMAL HIGH (ref 121–224)
LDL Chol Calc (NIH): 128 mg/dL — ABNORMAL HIGH (ref 0–99)
Lymphocytes Absolute: 1 10*3/uL (ref 0.7–3.1)
Lymphs: 22 %
MCH: 31.6 pg (ref 26.6–33.0)
MCHC: 34.8 g/dL (ref 31.5–35.7)
MCV: 91 fL (ref 79–97)
Monocytes Absolute: 0.4 10*3/uL (ref 0.1–0.9)
Monocytes: 10 %
Neutrophils Absolute: 2.9 10*3/uL (ref 1.4–7.0)
Neutrophils: 65 %
Phosphorus: 3.3 mg/dL (ref 2.8–4.1)
Platelets: 149 10*3/uL — ABNORMAL LOW (ref 150–450)
Potassium: 4.1 mmol/L (ref 3.5–5.2)
RBC: 5.12 x10E6/uL (ref 4.14–5.80)
RDW: 11.8 % (ref 11.6–15.4)
Sodium: 141 mmol/L (ref 134–144)
T3 Uptake Ratio: 26 % (ref 24–39)
T4, Total: 8.3 ug/dL (ref 4.5–12.0)
TSH: 1.45 u[IU]/mL (ref 0.450–4.500)
Total Protein: 6.3 g/dL (ref 6.0–8.5)
Triglycerides: 93 mg/dL (ref 0–149)
Uric Acid: 6.9 mg/dL (ref 3.8–8.4)
VLDL Cholesterol Cal: 17 mg/dL (ref 5–40)
WBC: 4.4 10*3/uL (ref 3.4–10.8)
eGFR: 80 mL/min/{1.73_m2} (ref >59)

## 2021-09-26 LAB — VITAMIN D 25 HYDROXY (VIT D DEFICIENCY, FRACTURES): Vit D, 25-Hydroxy: 20 ng/mL — ABNORMAL LOW (ref 30.0–100.0)

## 2021-09-26 LAB — HGB A1C W/O EAG: Hgb A1c MFr Bld: 5.5 % (ref 4.8–5.6)

## 2021-09-26 MED ORDER — VITAMIN D (ERGOCALCIFEROL) 1.25 MG (50000 UNIT) PO CAPS
50000.0000 [IU] | ORAL_CAPSULE | ORAL | 0 refills | Status: DC
Start: 2021-09-26 — End: 2022-01-01

## 2021-09-26 NOTE — Addendum Note (Signed)
Addended by: Gerarda Fraction A on: 09/26/2021 09:44 AM   Modules accepted: Orders

## 2021-09-27 ENCOUNTER — Encounter: Payer: Self-pay | Admitting: Infectious Diseases

## 2021-09-28 NOTE — Progress Notes (Signed)
Spoke with pt by phone. He has reviewed MyChart notes. Declines starting Vit D weekly supplement at this time. Has daily OTC supplement at home but has not been taking it. Will start this back at 2000 units daily and get more sun exposure as weather is improving. Agreeable to rechecking level in 3 months. Appt made for 12/31/21.

## 2021-09-29 NOTE — Progress Notes (Signed)
Noted patient will restart his OTC vitamin D and recheck level in 3 months.

## 2021-10-08 ENCOUNTER — Other Ambulatory Visit: Payer: Self-pay | Admitting: Infectious Diseases

## 2021-10-08 DIAGNOSIS — B20 Human immunodeficiency virus [HIV] disease: Secondary | ICD-10-CM

## 2021-10-13 ENCOUNTER — Encounter: Payer: Self-pay | Admitting: Podiatry

## 2021-10-13 ENCOUNTER — Ambulatory Visit: Payer: No Typology Code available for payment source | Admitting: Podiatry

## 2021-10-13 DIAGNOSIS — M722 Plantar fascial fibromatosis: Secondary | ICD-10-CM

## 2021-10-13 NOTE — Progress Notes (Signed)
He presents today for follow-up of his Planter fasciitis right foot.  States that it is about 95% improved.  Has trouble getting his shoes on the right side as he is going to get a pair of Ortho feet tennis shoes and that he has ordered those and should be coming in shortly he states.  He states that he continues to wear his plantar fascia brace and utilize his meloxicam.  Objective: Vital signs stable alert oriented x3 minimal reproducible pain to palpation medial calcaneal tubercle.  Assessment: Resolving Planter fasciitis 95%.  Plan: Discussed etiology pathology and surgical therapies encouraged him to continue plantar fascia brace and meloxicam.  I will follow-up with him should he have any regression.

## 2021-11-07 ENCOUNTER — Other Ambulatory Visit: Payer: Self-pay | Admitting: Infectious Diseases

## 2021-11-07 DIAGNOSIS — B20 Human immunodeficiency virus [HIV] disease: Secondary | ICD-10-CM

## 2021-12-01 ENCOUNTER — Encounter: Payer: No Typology Code available for payment source | Admitting: Infectious Diseases

## 2021-12-02 ENCOUNTER — Encounter: Payer: Self-pay | Admitting: Infectious Diseases

## 2021-12-02 ENCOUNTER — Ambulatory Visit: Payer: No Typology Code available for payment source | Admitting: Infectious Diseases

## 2021-12-02 VITALS — BP 145/91 | HR 93 | Temp 97.6°F | Ht 71.0 in | Wt 229.0 lb

## 2021-12-02 DIAGNOSIS — B20 Human immunodeficiency virus [HIV] disease: Secondary | ICD-10-CM | POA: Diagnosis not present

## 2021-12-02 DIAGNOSIS — E782 Mixed hyperlipidemia: Secondary | ICD-10-CM

## 2021-12-02 DIAGNOSIS — Z79899 Other long term (current) drug therapy: Secondary | ICD-10-CM | POA: Diagnosis not present

## 2021-12-02 DIAGNOSIS — Z113 Encounter for screening for infections with a predominantly sexual mode of transmission: Secondary | ICD-10-CM

## 2021-12-02 NOTE — Assessment & Plan Note (Signed)
Doing better Will continue to watch No statin at this point.

## 2021-12-02 NOTE — Assessment & Plan Note (Signed)
He is doing very well New partner, on prep, U=U.  No change in ART.  Will check his CD4 and VL today rtc in 12 months.

## 2021-12-02 NOTE — Progress Notes (Signed)
   Subjective:    Patient ID: Barry Zhang, male  DOB: 1966-02-14, 56 y.o.        MRN: [id number]   HPI 56 yo M with hx of HIV+. Previously refused colonoscopy (still pushing to have at 55). He has been on dovato (07-2017 change from atripla started 09-2011). No problems with this.  He was seen 02-2018 with major depression.    At prev visit he had some concerns about mild thrombocytopenia.  Improved (149) at last draw.  DBP 91- attributes to distance from car to clinic.  In Jan 2023, had fall while walking on rocks and ice. Had 7 interior/and exterior sutures on forehead.  Then got COVID.  Feels well. No problems with dovato.  At prev visit, had ASCVD of 7%. Deferred statin.    HIV 1 RNA Quant  Date Value  10/20/2020 Not Detected Copies/mL  01/22/2020 <20 Copies/mL  10/17/2018 <20 NOT DETECTED copies/mL   CD4 T Cell Abs (/uL)  Date Value  10/20/2020 383 (L)  01/22/2020 488  10/17/2018 429     Health Maintenance  Topic Date Due  . COLONOSCOPY (Pts 45-61yrs Insurance coverage will need to be confirmed)  Never done  . COVID-19 Vaccine (4 - Booster for Pfizer series) 06/23/2020  . INFLUENZA VACCINE  11/24/2021  . TETANUS/TDAP  11/02/2025  . Hepatitis C Screening  Completed  . HIV Screening  Completed  . Zoster Vaccines- Shingrix  Completed  . HPV VACCINES  Aged Out      Review of Systems  Constitutional:  Negative for chills, fever and weight loss.  Respiratory:  Negative for cough and shortness of breath.   Gastrointestinal:  Negative for constipation and diarrhea.  Genitourinary:  Negative for dysuria.  Neurological:  Positive for headaches.  Psychiatric/Behavioral:  The patient does not have insomnia.    Please see HPI. All other systems reviewed and negative.     Objective:  Physical Exam Vitals reviewed.  Constitutional:      Appearance: Normal appearance. He is obese.  HENT:     Mouth/Throat:     Mouth: Mucous membranes are moist.     Pharynx: No  oropharyngeal exudate.  Eyes:     Extraocular Movements: Extraocular movements intact.     Pupils: Pupils are equal, round, and reactive to light.  Cardiovascular:     Rate and Rhythm: Normal rate and regular rhythm.  Pulmonary:     Effort: Pulmonary effort is normal.     Breath sounds: Normal breath sounds.  Abdominal:     General: Bowel sounds are normal. There is no distension.     Palpations: Abdomen is soft.     Tenderness: There is no abdominal tenderness.  Musculoskeletal:        General: Normal range of motion.     Cervical back: Normal range of motion and neck supple.     Right lower leg: No edema.     Left lower leg: No edema.  Neurological:     General: No focal deficit present.     Mental Status: He is alert.  Psychiatric:        Mood and Affect: Mood normal.          Assessment & Plan:

## 2021-12-03 LAB — T-HELPER CELLS (CD4) COUNT (NOT AT ARMC)
CD4 % Helper T Cell: 40 % (ref 33–65)
CD4 T Cell Abs: 366 /uL — ABNORMAL LOW (ref 400–1790)

## 2021-12-04 LAB — HIV-1 RNA ULTRAQUANT REFLEX TO GENTYP+: HIV1 RNA # SerPl PCR: <20 copies/mL

## 2021-12-07 ENCOUNTER — Other Ambulatory Visit: Payer: Self-pay | Admitting: Infectious Diseases

## 2021-12-07 DIAGNOSIS — B20 Human immunodeficiency virus [HIV] disease: Secondary | ICD-10-CM

## 2021-12-30 ENCOUNTER — Other Ambulatory Visit: Payer: Self-pay | Admitting: Registered Nurse

## 2021-12-30 DIAGNOSIS — E559 Vitamin D deficiency, unspecified: Secondary | ICD-10-CM

## 2021-12-31 ENCOUNTER — Other Ambulatory Visit: Payer: Self-pay

## 2021-12-31 ENCOUNTER — Encounter: Payer: Self-pay | Admitting: Registered Nurse

## 2021-12-31 ENCOUNTER — Other Ambulatory Visit: Payer: No Typology Code available for payment source

## 2021-12-31 DIAGNOSIS — E559 Vitamin D deficiency, unspecified: Secondary | ICD-10-CM

## 2021-12-31 NOTE — Patient Instructions (Signed)
Labs drawn with 23g butterfly in L antecubital without difficulty.  2x2 and coflex applied.  Pt instructed to removed after 15-20 minutes and replace with bandaid.  Pt verbalized understanding.

## 2021-12-31 NOTE — Telephone Encounter (Signed)
Patient contacted via telephone missed lab appt with RN Larina Bras this am.  Came to clinic in afternoon for lab draw with RN Larina Bras discussed will send my chart message with results once available typically 24 hours.  Patient verbalized understanding information and had no further questions at this time.

## 2022-01-01 LAB — VITAMIN D 25 HYDROXY (VIT D DEFICIENCY, FRACTURES): Vit D, 25-Hydroxy: 23 ng/mL — ABNORMAL LOW (ref 30.0–100.0)

## 2022-01-06 ENCOUNTER — Other Ambulatory Visit: Payer: Self-pay | Admitting: Infectious Diseases

## 2022-01-06 DIAGNOSIS — B20 Human immunodeficiency virus [HIV] disease: Secondary | ICD-10-CM

## 2022-01-19 IMAGING — DX DG CHEST 1V PORT
1 series · 1 of 1 positions shown · non-contrast
Comparison: None.

CLINICAL DATA: Shortness of breath and chest tightness since this
morning.

EXAM:
PORTABLE CHEST 1 VIEW

[chest]
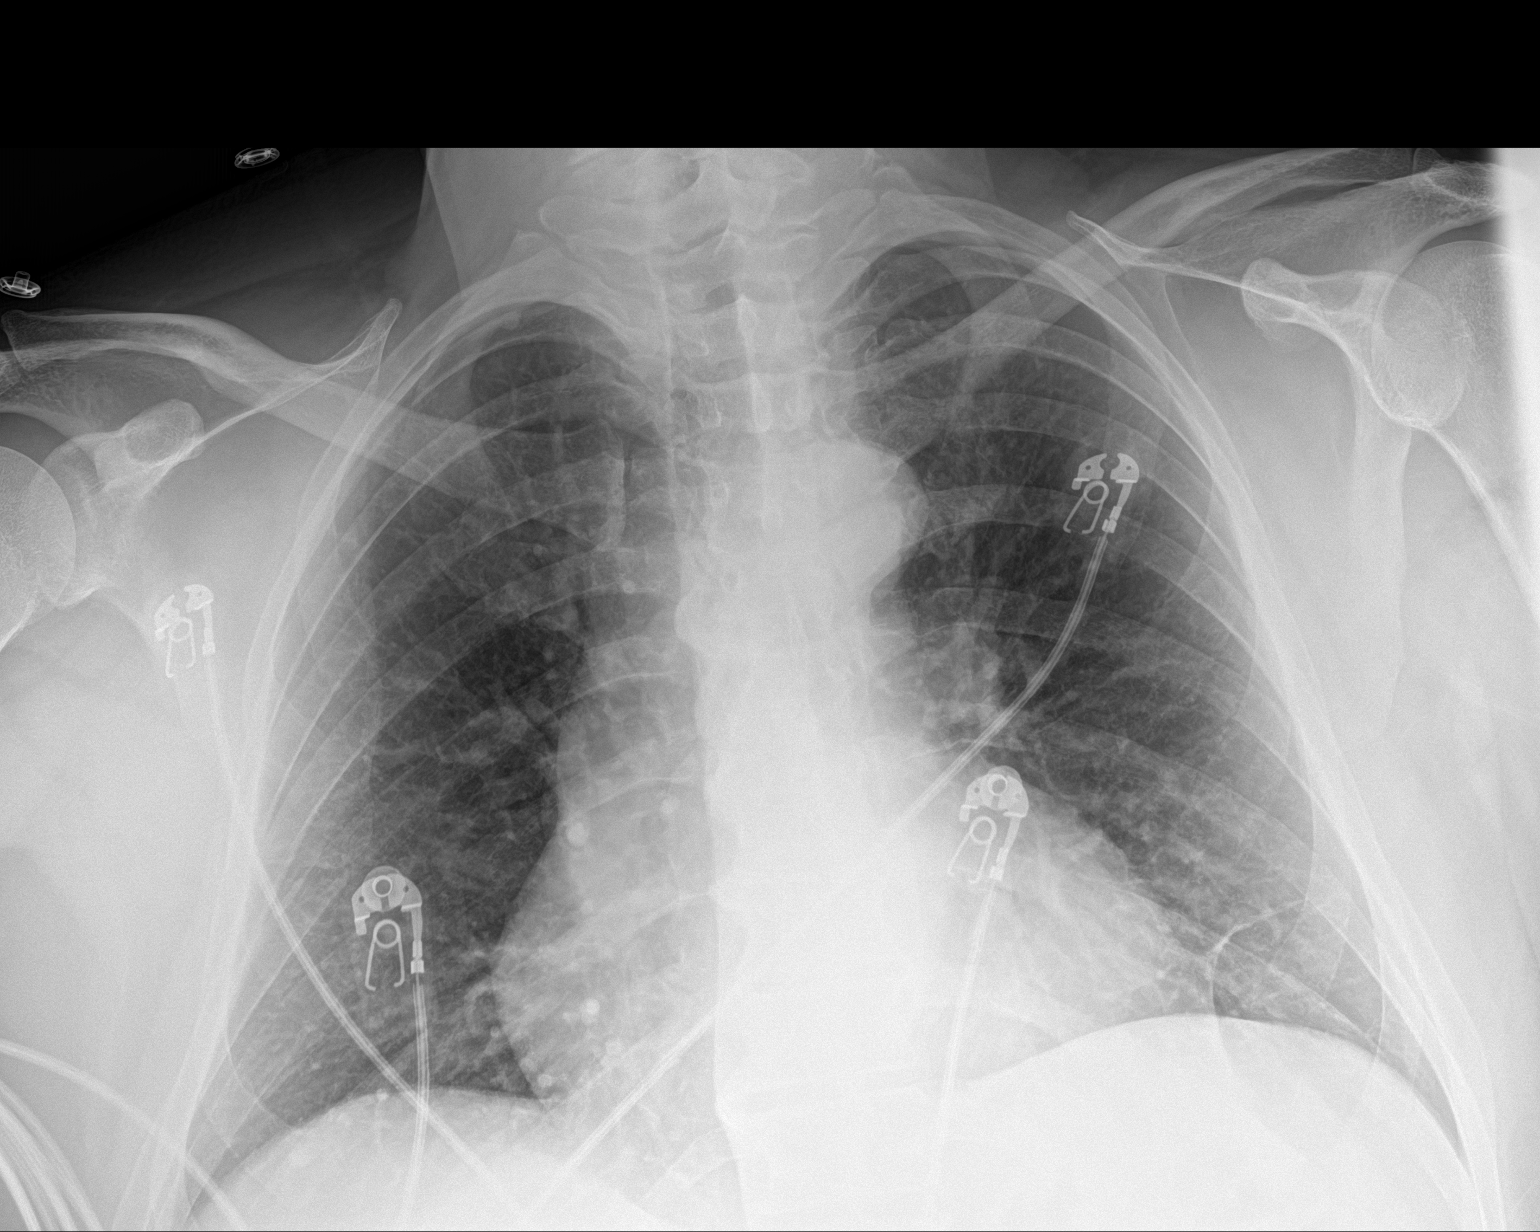

[1 of 1 positions shown; findings below may reference images not displayed]

FINDINGS: Mildly enlarged cardiac silhouette. Mildly tortuous aorta. Minimally
prominent interstitial markings. Mildly prominent pulmonary
vasculature. No Kerley lines or pleural fluid seen. Thoracic spine
degenerative changes.
IMPRESSION: Mild cardiomegaly, mild pulmonary vascular congestion and minimal
chronic interstitial lung disease.

## 2022-01-22 ENCOUNTER — Other Ambulatory Visit: Payer: Self-pay | Admitting: Registered Nurse

## 2022-01-22 DIAGNOSIS — E559 Vitamin D deficiency, unspecified: Secondary | ICD-10-CM

## 2022-02-05 ENCOUNTER — Other Ambulatory Visit: Payer: Self-pay | Admitting: Infectious Diseases

## 2022-02-05 DIAGNOSIS — B20 Human immunodeficiency virus [HIV] disease: Secondary | ICD-10-CM

## 2022-03-09 ENCOUNTER — Other Ambulatory Visit: Payer: Self-pay | Admitting: Infectious Diseases

## 2022-03-09 DIAGNOSIS — B20 Human immunodeficiency virus [HIV] disease: Secondary | ICD-10-CM

## 2022-03-25 ENCOUNTER — Other Ambulatory Visit: Payer: Self-pay | Admitting: Registered Nurse

## 2022-03-31 NOTE — Telephone Encounter (Signed)
Noted will have lab results 04/06/22 from [facility]

## 2022-04-05 ENCOUNTER — Other Ambulatory Visit: Payer: Self-pay | Admitting: Occupational Medicine

## 2022-04-05 DIAGNOSIS — E559 Vitamin D deficiency, unspecified: Secondary | ICD-10-CM

## 2022-04-05 NOTE — Progress Notes (Signed)
Lab drawn from Left AC tolerated well no issues noted.  Also given saline spray and Sudanyl for sinus congestion. Recently seen by PCP for Virus.

## 2022-04-06 ENCOUNTER — Other Ambulatory Visit: Payer: Self-pay | Admitting: Registered Nurse

## 2022-04-06 DIAGNOSIS — Z8639 Personal history of other endocrine, nutritional and metabolic disease: Secondary | ICD-10-CM

## 2022-04-06 LAB — VITAMIN D 25 HYDROXY (VIT D DEFICIENCY, FRACTURES): Vit D, 25-Hydroxy: 37.4 ng/mL (ref 30.0–100.0)

## 2022-04-06 MED ORDER — VITAMIN D3 50 MCG (2000 UT) PO CAPS: 2000.0000 [IU] | ORAL_CAPSULE | Freq: Every day | ORAL | 11 refills | Status: DC

## 2022-04-06 NOTE — Telephone Encounter (Signed)
Vitamin D level normal will start 2000 units by mouth daily over the counter now.  My chart message sent to patient see results note.  Rx refusal sent to patient pharmacy.

## 2022-04-07 ENCOUNTER — Encounter: Payer: Self-pay | Admitting: Registered Nurse

## 2022-04-07 ENCOUNTER — Telehealth: Payer: Self-pay | Admitting: Registered Nurse

## 2022-04-07 DIAGNOSIS — R58 Hemorrhage, not elsewhere classified: Secondary | ICD-10-CM

## 2022-04-07 NOTE — Telephone Encounter (Signed)
Late entry patient seen in clinic 04/06/22 reported noticed bruising left anterior fossa/right upper arm after removing gauze/cobain dressing Monday evening. Denied pain/fever/chills/bleeding.  Marked out bruise with pen.  Enlarged some overnight.  Denied numbness/tingling/arm pain/redness/swelling.  Bruise concerning him.  Discussed area soft no fluctuance/hard area most likely had oozing from vein or if lifting heavy item after venipuncture dislodged clot and some bleeding into arm tissues.  Full AROM elbow/shoulder nontender no increased temperature.  Discussed continue to monitor if redness/tenderness/worsening enlarging to notify clinic staff.  Patient verbalized understanding information/instructions, agreed with plan of care and had no further questions at that time.

## 2022-04-07 NOTE — Telephone Encounter (Signed)
My chart message left for patient Barry Zhang,     I reviewed your labs again today just to see if you had anything else that could contribute and your platelets typically run low also.  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  Or just computer work?        01/22/20 15:26  Sodium: 143  Potassium: 4.5  Chloride: 108  CO2: 26  Glucose: 85  BUN: 27 (H)  Creatinine: 1.21  Calcium: 9.4  BUN/Creatinine Ratio: 22  AG Ratio: 2.2  AST: 20  ALT: 24  Total Protein: 6.3  Total Bilirubin: 0.6  GFR, Est Non African American: 67  GFR, Est African American: 78  Total CHOL/HDL Ratio: 4.4  Cholesterol: 204 (H)  HDL Cholesterol: 46  LDL Cholesterol (Calc): 126 (H)  Non-HDL Cholesterol (Calc): 158 (H)  Triglycerides: 202 (H)  Alkaline phosphatase (APISO): 62  Globulin: 2.0  WBC: 4.2  RBC: 5.01  Hemoglobin: 16.6  HCT: 47.5  MCV: 94.8  MCH: 33.1 (H)  MCHC: 34.9  RDW: 12.3  Platelets: 139 (L)  MPV: 12.5  Neutrophils: 60.9  Monocytes Relative: 10.7  Eosinophil: 1.4  Basophil: 0.5  NEUT#: 2,558  Lymphocyte #: 1,113  Total Lymphocyte: 26.5  Eosinophils Absolute: 59  Basophils Absolute: 21  Absolute Monocytes: 449     10/20/20 09:56  Sodium: 142  Potassium: 4.4  Chloride: 107 (H)  Glucose: 96  BUN: 17  Creatinine: 1.23  Calcium: 9.0  BUN/Creatinine Ratio: 14  eGFR: 69  Phosphorus: 3.1  Alkaline Phosphatase: 73  Albumin: 4.1  Albumin/Globulin Ratio: 1.9  Uric Acid: 6.9  AST: 35  ALT: 33  Total Protein: 6.3  Total Bilirubin: 0.6  GGT: 9  Estimated CHD Risk: 1.1 (H)  LDH: 245 (H)  Total CHOL/HDL Ratio: 5.2 (H)  Cholesterol, Total: 214 (H)  HDL Cholesterol: 41  Triglycerides: 150 (H)  VLDL Cholesterol Cal: 27  LDL Chol Calc (NIH): 146 (H)  Iron: 135  Globulin, Total: 2.2  WBC: 4.5  RBC: 5.28  Hemoglobin: 16.6  HCT: 49.8  MCV: 94  MCH: 31.4  MCHC: 33.3  RDW:  12.4  Platelets: 113 (L)  Neutrophils: 60  Immature Granulocytes: 0  NEUT#: 2.7  Lymphocyte #: 1.1  Monocytes Absolute: 0.5  Basophils Absolute: 0.0  Immature Grans (Abs): 0.0  Lymphs: 25  Monocytes: 12  Basos: 1  Eos: 2  EOS (ABSOLUTE): 0.1     10/20/20 15:33  COMPREHENSIVE METABOLIC PANEL: Rpt  Sodium: 140  Potassium: 4.5  Chloride: 105  CO2: 25  Glucose: 90  BUN: 23  Creatinine: 1.21  Calcium: 9.0  BUN/Creatinine Ratio: NOT APPLICABLE  AG Ratio: 1.7  AST: 30  ALT: 32  Total Protein: 6.3  Total Bilirubin: 0.6  Total CHOL/HDL Ratio: 5.7 (H)  Cholesterol: 211 (H)  HDL Cholesterol: 37 (L)  LDL Cholesterol (Calc): Pend  Non-HDL Cholesterol (Calc): 174 (H)  Triglycerides: 430 (H)  Alkaline phosphatase (APISO): 71  Globulin: 2.3  WBC: 4.8  RBC: 5.30  Hemoglobin: 17.1  HCT: 50.5 (H)  MCV: 95.3  MCH: 32.3  MCHC: 33.9  RDW: 12.3  Platelets: 120 (L)  MPV: 12.3     09/25/21 09:49  Sodium: 141  Potassium: 4.1  Chloride: 108 (H)  Glucose: 100 (H)  BUN: 23  Creatinine: 1.09  Calcium: 9.1  BUN/Creatinine Ratio: 21 (H)  eGFR: 80  Phosphorus: 3.3  Alkaline Phosphatase: 72  Albumin: 4.3  Albumin/Globulin Ratio: 2.2  Uric Acid: 6.9  AST:  17  ALT: 29  Total Protein: 6.3  Total Bilirubin: 0.6  GGT: 9  Estimated CHD Risk: 0.8  LDH: 240 (H)  Total CHOL/HDL Ratio: 4.1  Cholesterol, Total: 192  HDL Cholesterol: 47  Triglycerides: 93  VLDL Cholesterol Cal: 17  LDL Chol Calc (NIH): 128 (H)  Iron: 132  Vitamin D, 25-Hydroxy: 20.0 (L)  Globulin, Total: 2.0  WBC: 4.4  RBC: 5.12  Hemoglobin: 16.2  HCT: 46.6  MCV: 91  MCH: 31.6  MCHC: 34.8  RDW: 11.8  Platelets: 149 (L)  Neutrophils: 65  Immature Granulocytes: 0  NEUT#: 2.9  Lymphocyte #: 1.0  Monocytes Absolute: 0.4  Basophils Absolute: 0.0  Immature Grans (Abs): 0.0  Lymphs: 22  Monocytes:  10  Basos: 1  Eos: 2  EOS (ABSOLUTE): 0.1   (H): Data is abnormally high  (L): Data is abnormally low  Patient left message for NP he did normal carrying and lifting associated with his job after having his blood drawn by [employer] Monday.  Did not remove cobain until he was at home that evening when he noticed bruising.  Denied any new or worsening symptoms today.

## 2022-04-07 NOTE — Telephone Encounter (Signed)
Message left for patient at work with lab results/instructions Vitamin D and follow up bruising left arm.  Patient sent exitcare handout on contusion

## 2022-04-28 NOTE — Telephone Encounter (Signed)
Patient left message just my daily lifting and carrying denied new supplements/workouts/job duties.

## 2022-05-26 ENCOUNTER — Telehealth: Payer: Self-pay

## 2022-05-26 NOTE — Telephone Encounter (Signed)
A Prior Authorization RENEWAL was initiated for this patients DOVATO via fax form [employer].  Application in Dr Shana Chute box for [employer]

## 2022-06-03 ENCOUNTER — Other Ambulatory Visit (HOSPITAL_COMMUNITY): Payer: Self-pay

## 2022-06-15 NOTE — Telephone Encounter (Signed)
Rec'd fax from [employer]. PA has been closed because this claim does not require a PA at this time. No further action needed.

## 2022-07-01 ENCOUNTER — Other Ambulatory Visit (HOSPITAL_COMMUNITY): Payer: Self-pay

## 2022-07-22 ENCOUNTER — Telehealth: Payer: Self-pay | Admitting: Registered Nurse

## 2022-07-22 ENCOUNTER — Encounter: Payer: Self-pay | Admitting: Registered Nurse

## 2022-07-22 DIAGNOSIS — Z0000 Encounter for general adult medical examination without abnormal findings: Secondary | ICD-10-CM

## 2022-07-22 NOTE — Telephone Encounter (Signed)
Epic reviewed last labs June 2023; executive panel, Hgba1c and vitamin D ordered for patient fasting RN Kimrey notified to schedule patient.

## 2022-08-10 ENCOUNTER — Ambulatory Visit: Payer: Self-pay | Admitting: Registered Nurse

## 2022-08-10 ENCOUNTER — Other Ambulatory Visit (HOSPITAL_COMMUNITY): Payer: Self-pay

## 2022-08-10 VITALS — BP 134/86 | HR 79

## 2022-08-10 DIAGNOSIS — L308 Other specified dermatitis: Secondary | ICD-10-CM

## 2022-08-10 MED ORDER — DIPHENHYDRAMINE HCL 25 MG PO CAPS: 25.0000 mg | ORAL_CAPSULE | Freq: Every evening | ORAL | 0 refills | Status: AC | PRN

## 2022-08-10 MED ORDER — PREDNISONE 10 MG PO TABS
ORAL_TABLET | ORAL | 0 refills | Status: AC
  Filled 2022-08-10: qty 42, 12d supply, fill #0

## 2022-08-10 MED ORDER — AQUAPHOR EX OINT: TOPICAL_OINTMENT | CUTANEOUS | 0 refills | Status: AC | PRN

## 2022-08-10 MED ORDER — DIPHENHYDRAMINE HCL 2 % EX GEL: 1.0000 | Freq: Two times a day (BID) | CUTANEOUS | Status: AC | PRN

## 2022-08-10 MED ORDER — LORATADINE 10 MG PO TABS: 10.0000 mg | ORAL_TABLET | Freq: Every day | ORAL | 0 refills | Status: AC

## 2022-08-10 NOTE — Patient Instructions (Signed)
Contact Dermatitis Dermatitis is redness, soreness, and swelling (inflammation) of the skin. Contact dermatitis is a reaction to certain substances that touch the skin. There are two types of this condition: Irritant contact dermatitis. This is the most common type. It happens when something irritates your skin, such as when your hands get dry from washing them too often with soap. You can get this type of reaction even if you have not been exposed to the irritant before. Allergic contact dermatitis. This type is caused by a substance that you are allergic to, such as poison ivy. It occurs when you have been exposed to the substance (allergen) and form a sensitivity to it. In some cases, the reaction may start soon after your first exposure to the allergen. In other cases, it may not start until you are exposed to the allergen again. It may then occur every time you are exposed to the allergen in the future. What are the causes? Irritant contact dermatitis is often caused by exposure to: Makeup. Soaps, detergents, and bleaches. Acids. Metal salts, such as nickel. Allergic contact dermatitis is often caused by exposure to: Poisonous plants. Chemicals. Jewelry. Latex. Medicines. Preservatives in products, such as clothes. What increases the risk? You are more likely to get this condition if you have: A job that exposes you to irritants or allergens. Certain medical conditions. These include asthma and eczema. What are the signs or symptoms? Symptoms of this condition may occur in any place on your body that has been touched by the irritant. Symptoms include: Dryness, flaking, or cracking. Redness. Itching. Pain or a burning feeling. Blisters. Drainage of small amounts of blood or clear fluid from skin cracks. With allergic contact dermatitis, there may also be swelling in areas such as the eyelids, mouth, or genitals. How is this diagnosed? This condition is diagnosed with a medical  history and physical exam. A patch skin test may be done to help figure out the cause. If the condition is related to your job, you may need to see an expert in health problems in the workplace (occupational medicine specialist). How is this treated? This condition is treated by staying away from the cause of the reaction and protecting your skin from further contact. Treatment may also include: Steroid creams or ointments. Steroid medicines may need be taken by mouth (orally) in more severe cases. Antibiotics or medicines applied to the skin to kill bacteria (antibacterial ointments). These may be needed if a skin infection is present. Antihistamines. These may be taken orally or put on as a lotion to ease itching. A bandage (dressing). Follow these instructions at home: Skin care Moisturize your skin as needed. Put cool, wet cloths (cool compresses) on the affected areas. Try applying baking soda paste to your skin. Stir water into baking soda until it has the consistency of a paste. Do not scratch your skin. Avoid friction to the affected area. Avoid the use of soaps, perfumes, and dyes. Check the affected areas every day for signs of infection. Check for: More redness, swelling, or pain. More fluid or blood. Warmth. Pus or a bad smell. Medicines Take or apply over-the-counter and prescription medicines only as told by your health care provider. If you were prescribed antibiotics, take or apply them as told by your health care provider. Do not stop using the antibiotic even if you start to feel better. Bathing Try taking a bath with: Epsom salts. Follow the instructions on the packaging. You can get these at your local pharmacy   or grocery store. Baking soda. Pour a small amount into the bath as told by your health care provider. Colloidal oatmeal. Follow the instructions on the packaging. You can get this at your local pharmacy or grocery store. Bathe less often. This may mean bathing  every other day. Bathe in lukewarm water. Avoid using hot water. Bandage care If you were given a dressing, change it as told by your health care provider. Wash your hands with soap and water for at least 20 seconds before and after you change your dressing. If soap and water are not available, use hand sanitizer. General instructions Avoid the substance that caused your reaction. If you do not know what caused it, keep a journal to try to track what caused it. Write down: What you eat and drink. What cosmetics you use. What you wear in the affected area. This includes jewelry. Contact a health care provider if: Your condition does not get better with treatment. Your condition gets worse. You have any signs of infection. You have a fever. You have new symptoms. Your bone or joint under the affected area becomes painful after the skin has healed. Get help right away if: You notice red streaks coming from the affected area. The affected area turns darker. You have trouble breathing. This information is not intended to replace advice given to you by your health care provider. Make sure you discuss any questions you have with your health care provider. Document Revised: 10/16/2021 Document Reviewed: 10/16/2021 Elsevier Patient Education  2023 [employer]. Pruritus Pruritus is an itchy feeling on the skin. One of the most common causes is dry skin, but many different things can cause itching. Most cases of itching do not require medical attention. Sometimes itchy skin can turn into a rash or a secondary infection. Follow these instructions at home: Skin care  Do not use scented soaps, detergents, perfumes, and cosmetic products. Instead, use gentle, unscented versions of these items. Apply moisturizing creams to your skin frequently, at least twice daily. Apply immediately after bathing while skin is still wet. Take medicines or apply medicated creams only as told by your health care  provider. This may include: Corticosteroid cream or topical calcineurin inhibitor. Anti-itch lotions containing urea, camphor, or menthol. Oral antihistamines. Do not take hot showers or baths, which can make itching worse. A short, cool shower may help with itching as long as you apply moisturizing lotion after the shower. Apply a cool, wet cloth (cool compress) to the affected areas. You may take lukewarm baths with one of the following: Epsom salts. You can get these at your local pharmacy or grocery store. Follow the instructions on the packaging. Baking soda. Pour a small amount into the bath as told by your health care provider. Colloidal oatmeal. You can get this at your local pharmacy or grocery store. Follow the instructions on the packaging. Do not scratch your skin. General instructions Avoid wearing tight clothes. Keep a journal to help find out what is causing your itching. Write down: What you eat and drink. What cosmetic products you use. What soaps or detergents you use. What you wear, including jewelry. Use a humidifier. This keeps the air moist, which helps to prevent dry skin. Be aware of any changes in your itchiness. Tell your health care provider about any changes. Contact a health care provider if: The itching does not go away after several days. You notice redness, warmth, or drainage on the skin where you have scratched. You are unusually  thirsty or urinating more than normal. Your skin tingles or feels numb. Your skin or the white parts of your eyes turn yellow (jaundice). You feel weak. You have any of the following: Night sweats. Tiredness (fatigue). Weight loss. Abdominal pain. Summary Pruritus is an itchy feeling on the skin. One of the most common causes is dry skin, but many different conditions and factors can cause itching. Apply moisturizing creams to your skin frequently, at least twice daily. Apply immediately after bathing while skin is still  wet. Take medicines or apply medicated creams only as told by your health care provider. Do not take hot showers or baths. Do not use scented soaps, detergents, perfumes, or cosmetic products. Keep a journal to help find out what is causing your itching. This information is not intended to replace advice given to you by your health care provider. Make sure you discuss any questions you have with your health care provider. Document Revised: 05/20/2021 Document Reviewed: 05/20/2021 Elsevier Patient Education  2023 [employer].

## 2022-08-10 NOTE — Progress Notes (Signed)
Subjective:    Patient ID: Barry Zhang, male    DOB: Nov 22, 1965, 57 y.o.   MRN: [id number]  56y/o established causasian male rash started left arm itching a lot spread to chest and right arm and upper thighs now also thinks related to gardening.  Waistband rash also worsening would like refill of cream taken in 2018 as had recurrent but resolves with BID dosing x 2 days  Clotrimazole-Betamethasone 1-0.05 % 1 application  Topical 2 times daily, X 1 week to affected area  Has ntoiced he has been scratching a lot taking a benadryl tab prior to sleep since this weekend and helps but cannot take at work makes him drowsy.  Has tolerated oral prednisone taper in the past  well.    Review of Systems  Constitutional:  Negative for activity change, appetite change, chills, diaphoresis, fatigue and fever.  HENT:  Negative for trouble swallowing and voice change.   Eyes:  Negative for photophobia, pain, discharge, redness, itching and visual disturbance.  Respiratory:  Negative for cough, shortness of breath, wheezing and stridor.   Cardiovascular:  Negative for chest pain.  Gastrointestinal:  Negative for diarrhea, nausea and vomiting.  Musculoskeletal:  Negative for gait problem, neck pain and neck stiffness.  Skin:  Positive for color change and rash.  Allergic/Immunologic: Positive for environmental allergies.  Neurological:  Negative for dizziness, speech difficulty, light-headedness and headaches.  Psychiatric/Behavioral:  Negative for agitation, confusion and sleep disturbance.        Objective:   Physical Exam Vitals and nursing note reviewed.  Constitutional:      General: He is awake. He is not in acute distress.    Appearance: Normal appearance. He is well-developed, well-groomed and overweight. He is not ill-appearing, toxic-appearing or diaphoretic.  HENT:     Head: Normocephalic and atraumatic.     Jaw: There is normal jaw occlusion.     Salivary Glands: Right salivary  gland is not diffusely enlarged. Left salivary gland is not diffusely enlarged.     Right Ear: Hearing and external ear normal.     Left Ear: Hearing and external ear normal.     Nose: Nose normal. No congestion or rhinorrhea.     Mouth/Throat:     Lips: Pink. No lesions.     Mouth: Mucous membranes are moist. No oral lesions or angioedema.     Dentition: No gum lesions.     Tongue: No lesions. Tongue does not deviate from midline.     Palate: No mass and lesions.     Pharynx: Oropharynx is clear. Uvula midline. No uvula swelling.     Tonsils: No tonsillar exudate or tonsillar abscesses.  Eyes:     General: Lids are normal. Vision grossly intact. Gaze aligned appropriately. Allergic shiner present. No scleral icterus.       Right eye: No discharge.        Left eye: No discharge.     Extraocular Movements: Extraocular movements intact.     Conjunctiva/sclera: Conjunctivae normal.     Pupils: Pupils are equal, round, and reactive to light.  Neck:     Trachea: Trachea and phonation normal. No tracheal deviation.  Cardiovascular:     Rate and Rhythm: Normal rate and regular rhythm.     Pulses:          Radial pulses are 2+ on the right side and 2+ on the left side.     Heart sounds: Normal heart sounds, S1 normal and S2 normal.  Pulmonary:     Effort: Pulmonary effort is normal.     Breath sounds: Normal breath sounds and air entry. No stridor or transmitted upper airway sounds. No wheezing.     Comments: Spoke full sentences without difficulty; no cough observed in exam room Abdominal:     General: Abdomen is flat.  Musculoskeletal:        General: Normal range of motion.     Right shoulder: No crepitus. Normal range of motion. Normal strength.     Left shoulder: No crepitus. Normal range of motion. Normal strength.     Right hand: No swelling, deformity or lacerations. Normal range of motion. Normal strength.     Left hand: No swelling, deformity or lacerations. Normal range of  motion. Normal strength.     Cervical back: Normal range of motion and neck supple. No swelling, edema, deformity, erythema, signs of trauma, lacerations, rigidity, torticollis, tenderness or crepitus. No pain with movement. Normal range of motion.     Thoracic back: No swelling, edema, deformity, signs of trauma, lacerations, spasms or tenderness. Normal range of motion.  Lymphadenopathy:     Head:     Right side of head: No submandibular or preauricular adenopathy.     Left side of head: No submandibular or preauricular adenopathy.     Cervical:     Right cervical: No superficial cervical adenopathy.    Left cervical: No superficial cervical adenopathy.  Skin:    General: Skin is warm and dry.     Capillary Refill: Capillary refill takes less than 2 seconds.     Coloration: Skin is not ashen, cyanotic, jaundiced, mottled, pale or sallow.     Findings: Abrasion and rash present. No abscess, acne, bruising, burn, ecchymosis, erythema, signs of injury, laceration, petechiae or wound. Rash is macular and papular. Rash is not crusting, nodular, purpuric, pustular, scaling, urticarial or vesicular.     Nails: There is no clubbing.          Comments: Macular erythema base with grouped papules anterior chest, right forearm, left upper arm and bilateral thighs dry; linear abrasions noted left upper arm; no discharge/vesicles/fluctuance  Neurological:     General: No focal deficit present.     Mental Status: He is alert and oriented to person, place, and time. Mental status is at baseline.     GCS: GCS eye subscore is 4. GCS verbal subscore is 5. GCS motor subscore is 6.     Cranial Nerves: Cranial nerves 2-12 are intact. No cranial nerve deficit, dysarthria or facial asymmetry.     Sensory: Sensation is intact.     Motor: Motor function is intact. No weakness, tremor, atrophy, abnormal muscle tone or seizure activity.     Coordination: Coordination is intact. Coordination normal.     Gait: Gait  is intact. Gait normal.     Comments: In/out of chair  and on/off exam table without difficulty; gait sure and steady in clinic; bilateral hand grasp equal 5/5  Psychiatric:        Attention and Perception: Attention and perception normal.        Mood and Affect: Mood and affect normal.        Speech: Speech normal.        Behavior: Behavior normal. Behavior is cooperative.        Thought Content: Thought content normal.        Cognition and Memory: Cognition and memory normal.        Judgment:  Judgment normal.           Assessment & Plan:  A-pruritic dermatitis   P-Has had poison ivy/oak in the past.  Typically 60mg  taper over 2 weeks sufficient.  Dispensed prednisone 2 days each 60/50/40/30/20/10mg  take with breakfast po daily #42 RF0 from [facility] to patient.  Widespread greater than 20% body surface area.  1-2mg /kg Prednisone (max 60mg ) for 7-10 days and taper over next 7-10 days per Up to Date.  Symptomatic therapy suggested e.g. Calagel, emollient, benadryl or OTC zyrtec 10mg  po BID prn.  Warm to cool water soaks and/or oatmeal baths. Avoid hot/steamy showers.  Claritin available in clinic patient going to trial am claritin 10mg  and qhs benadryl 25mg  po until prednisone taper has helped.  Right now benadryl, shower and cocoa butter helping the most for symptoms.   Call or return to clinic as needed if these symptoms worsen or fail to improve as anticipated especially lesions noted on eye, visual changes or visual loss. Avoid scratching lesions to prevent secondary infections.  May apply ice to itchy areas if po/topical meds not yet active systemically or wearing off prior to next dose.  Exitcare handout on contact dermatitis and pruritis.  Patient verbalized agreement and understanding of treatment plan and had no further questions at this time.    Patient with waistline recurrent rash typically needs clotrimazole-betamethasone to resolve ?tinea flare with sweating 90 degree weather this  week.  Medication as directed. Call or return to clinic as needed if these symptoms worsen or fail to improve as anticipated.Hygiene e.g. do not stay in sweat soaked clothes/change mask if wet mid shift at work and shower with selsun blue shampoo prn (apply and leave on skin for 10 minutes to affected areas then rinse off).  Reoccurrence of condition common and may require retreatment. Sometimes using blowdryer to dry skin thoroughly also helpful. Will see if rash improves with prednisone oral taper and if not will send in refill rx to his pharmacy Patient verbalized agreement and understanding of treatment plan and had no further questions at this time. P2: Avoidance and hand washing.   P2:  Avoidance and hand washing.

## 2022-08-11 ENCOUNTER — Encounter: Payer: Self-pay | Admitting: Occupational Medicine

## 2022-08-12 NOTE — Telephone Encounter (Signed)
Patient reports not gone but is improving.

## 2022-08-19 ENCOUNTER — Encounter: Payer: Self-pay | Admitting: Registered Nurse

## 2022-08-19 ENCOUNTER — Ambulatory Visit: Payer: Self-pay | Admitting: Registered Nurse

## 2022-08-19 ENCOUNTER — Other Ambulatory Visit: Payer: Self-pay | Admitting: Occupational Medicine

## 2022-08-19 ENCOUNTER — Other Ambulatory Visit (HOSPITAL_COMMUNITY): Payer: Self-pay

## 2022-08-19 VITALS — BP 107/72 | HR 68 | Resp 16

## 2022-08-19 DIAGNOSIS — L308 Other specified dermatitis: Secondary | ICD-10-CM

## 2022-08-19 DIAGNOSIS — Z0000 Encounter for general adult medical examination without abnormal findings: Secondary | ICD-10-CM

## 2022-08-19 DIAGNOSIS — Z8639 Personal history of other endocrine, nutritional and metabolic disease: Secondary | ICD-10-CM

## 2022-08-19 MED ORDER — CLOTRIMAZOLE-BETAMETHASONE 1-0.05 % EX CREA: 1.0000 | TOPICAL_CREAM | Freq: Every day | CUTANEOUS | 0 refills | Status: AC

## 2022-08-19 NOTE — Progress Notes (Signed)
Lab drawn tolerated well no issues noted.   

## 2022-08-19 NOTE — Progress Notes (Signed)
Subjective:    Patient ID: Barry Zhang, male    DOB: October 11, 1965, 57 y.o.   MRN: [id number]  56y/o established caucasian male last seen 08/10/22 started on prednisone taper for contact dermatitis.  Bilateral arms and left leg rash has completely cleared.  Rash at anterior waist still erythematous/itchy.  Patient would like refill of cream he has used before with good success.  Right anterior thigh has small spot that has been persistent also.  Denied fever/chills/n/v/d/discharge/worsening rash/dyspnea/throat/tongue swelling/dysphagia/dysphasia.  Patient has been applying lotion to dry skin on body but not improving.      Review of Systems  Constitutional:  Negative for chills and fever.  HENT:  Negative for trouble swallowing and voice change.   Respiratory:  Negative for cough, chest tightness, shortness of breath, wheezing and stridor.   Gastrointestinal:  Negative for diarrhea, nausea and vomiting.  Skin:  Positive for rash. Negative for pallor and wound.  Psychiatric/Behavioral:  Negative for agitation, confusion and sleep disturbance.        Objective:   Physical Exam Vitals and nursing note reviewed.  Constitutional:      General: He is awake. He is not in acute distress.    Appearance: Normal appearance. He is well-developed, well-groomed and overweight. He is not ill-appearing, toxic-appearing or diaphoretic.  HENT:     Head: Normocephalic and atraumatic.     Jaw: There is normal jaw occlusion.     Salivary Glands: Right salivary gland is not diffusely enlarged. Left salivary gland is not diffusely enlarged.     Right Ear: Hearing and external ear normal.     Left Ear: Hearing and external ear normal.     Nose: Nose normal. No congestion or rhinorrhea.     Mouth/Throat:     Lips: Pink. No lesions.     Mouth: Mucous membranes are moist. No oral lesions or angioedema.     Dentition: No gum lesions.     Tongue: No lesions. Tongue does not deviate from midline.      Palate: No mass and lesions.     Pharynx: Oropharynx is clear. Uvula midline. No uvula swelling.  Eyes:     General: Lids are normal. Vision grossly intact. Gaze aligned appropriately. Allergic shiner present. No scleral icterus.       Right eye: No discharge.        Left eye: No discharge.     Extraocular Movements: Extraocular movements intact.     Conjunctiva/sclera: Conjunctivae normal.     Pupils: Pupils are equal, round, and reactive to light.  Neck:     Trachea: Trachea normal.  Cardiovascular:     Rate and Rhythm: Normal rate and regular rhythm.  Pulmonary:     Effort: Pulmonary effort is normal.     Breath sounds: Normal breath sounds and air entry. No stridor or transmitted upper airway sounds. No wheezing.     Comments: Spoke full sentences without difficulty; no cough observed in exam room Abdominal:     General: Abdomen is flat.  Musculoskeletal:        General: Normal range of motion.     Cervical back: Normal range of motion and neck supple.  Lymphadenopathy:     Head:     Right side of head: No submandibular or preauricular adenopathy.     Left side of head: No submandibular or preauricular adenopathy.     Cervical:     Right cervical: No superficial cervical adenopathy.    Left cervical: No  superficial cervical adenopathy.  Skin:    General: Skin is warm and dry.     Capillary Refill: Capillary refill takes less than 2 seconds.     Coloration: Skin is not ashen, cyanotic, jaundiced, mottled, pale or sallow.     Findings: Rash present. No abrasion, bruising, burn, erythema, signs of injury, laceration, petechiae or wound. Rash is macular and scaling.          Comments: Macular hyperpigmented rash waistband anterior 4x7cm with some fine scaling  Neurological:     General: No focal deficit present.     Mental Status: He is alert and oriented to person, place, and time. Mental status is at baseline.     Motor: Motor function is intact. No weakness, tremor,  abnormal muscle tone or seizure activity.     Coordination: Coordination is intact. Coordination normal.     Gait: Gait is intact. Gait normal.     Comments: In/out of chair without difficulty; gait sure and steady in clinic; bilateral hand grasp equal 5/5  Psychiatric:        Attention and Perception: Attention and perception normal.        Mood and Affect: Mood and affect normal.        Speech: Speech normal.        Behavior: Behavior normal. Behavior is cooperative.        Thought Content: Thought content normal.        Cognition and Memory: Cognition and memory normal.        Judgment: Judgment normal.           Assessment & Plan:  A-pruritic dermatitis torso subsequent encounter  P-refilled clotrimazole-betamethasone cream apply BID affected area waist and right thigh x 2 weeks #1 RF0 for patient to his pharmacy of choice is on formulary per epic review last filled 2019.  Betamethasone diproprionate 0.05% cream is not on formulary for patient insurance last Rx 2022.  Restart aquaphor ointment to dry skin after bathing given 8 UD from clinic stock.  Avoid scratching.  May use ice 15 minutes QID prn pain/itching/swelling.  Ensure you change clothes if sweat soaked do not stay in wet clothes for prolonged periods. Avoid long steamy showers or baths.  Wash hands before and after application of medication cream.   Exitcare handout tinea versicolor and atopic dermatitis.  Follow up re-evaluation if no improvement/resolution with plan of care x 2 weeks or sooner if worsening.  Patient agreed with plan of care and had no further questions at this time.

## 2022-08-19 NOTE — Telephone Encounter (Signed)
Patient instructed to come to clinic for re-evaluation office visit today.  Appt scheduled see office note

## 2022-08-19 NOTE — Patient Instructions (Signed)
Atopic Dermatitis Atopic dermatitis is a skin disorder that causes inflammation of the skin. It is marked by a red rash and itchy, dry, scaly skin. It is the most common type of eczema. Eczema is a group of skin conditions that cause the skin to become rough and swollen. This condition is generally worse during the cooler winter months and often improves during the warm summer months. Atopic dermatitis usually starts showing signs in infancy and can last through adulthood. This condition cannot be passed from one person to another (is not contagious). Atopic dermatitis may not always be present, but when it is, it is called a flare-up. What are the causes? The exact cause of this condition is not known. Flare-ups may be triggered by: Coming in contact with something that you are sensitive or allergic to (allergen). Stress. Certain foods. Extremely hot or cold weather. Harsh chemicals and soaps. Dry air. Chlorine. What increases the risk? This condition is more likely to develop in people who have a personal or family history of: Eczema. Allergies. Asthma. Hay fever. What are the signs or symptoms? Symptoms of this condition include: Dry, scaly skin. Red, itchy rash. Itchiness, which can be severe. This may occur before the skin rash. This can make sleeping difficult. Skin thickening and cracking that can occur over time. How is this diagnosed? This condition is diagnosed based on: Your symptoms. Your medical history. A physical exam. How is this treated? There is no cure for this condition, but symptoms can usually be controlled. Treatment focuses on: Controlling the itchiness and scratching. You may be given medicines, such as antihistamines or steroid creams. Limiting exposure to allergens. Recognizing situations that cause stress and developing a plan to manage stress. If your atopic dermatitis does not get better with medicines, or if it is all over your body (widespread), a  treatment using a specific type of light (phototherapy) may be used. Follow these instructions at home: Skin care  Keep your skin well moisturized. Doing this seals in moisture and helps to prevent dryness. Use unscented lotions that have petroleum in them. Avoid lotions that contain alcohol or water. They can dry the skin. Keep baths or showers short (less than 5 minutes) in warm water. Do not use hot water. Use mild, unscented cleansers for bathing. Avoid soap and bubble bath. Apply a moisturizer to your skin right after a bath or shower. Do not apply anything to your skin without checking with your health care provider. General instructions Take or apply over-the-counter and prescription medicines only as told by your health care provider. Dress in clothes made of cotton or cotton blends. Dress lightly because heat increases itchiness. When washing your clothes, rinse your clothes twice so all of the soap is removed. Avoid any triggers that can cause a flare-up. Keep your fingernails cut short. Avoid scratching. Scratching makes the rash and itchiness worse. A break in the skin from scratching could result in a skin infection (impetigo). Do not be around people who have cold sores or fever blisters. If you get the infection, it may cause your atopic dermatitis to worsen. Keep all follow-up visits. This is important. Contact a health care provider if: Your itchiness interferes with sleep. Your rash gets worse or is not better within one week of starting treatment. You have a fever. You have a rash flare-up after having contact with someone who has cold sores or fever blisters. Get help right away if: You develop pus or soft yellow scabs in the rash  area. Summary Atopic dermatitis causes a red rash and itchy, dry, scaly skin. Treatment focuses on controlling the itchiness and scratching, limiting exposure to things that you are sensitive or allergic to (allergens), recognizing  situations that cause stress, and developing a plan to manage stress. Keep your skin well moisturized. Keep baths or showers shorter than 5 minutes and use warm water. Do not use hot water. This information is not intended to replace advice given to you by your health care provider. Make sure you discuss any questions you have with your health care provider. Document Revised: 01/21/2020 Document Reviewed: 01/21/2020 Elsevier Patient Education  2023 [employer] Inc. Tinea Versicolor  Tinea versicolor is a common fungal infection. It causes a rash that looks like light or dark patches on the skin. The rash most often occurs on the chest, back, neck, or upper arms. This condition is more common during warm weather. Tinea versicolor usually does not cause any other problems than the rash. In most cases, the infection goes away in a few weeks with treatment. It may take a few months for the patches on your skin to return to your usual skin color. What are the causes? This condition occurs when a certain type of fungus (Malassezia furfur) that is normally present on the skin starts to grow too much. This fungus is a type of yeast. This condition cannot be passed from one person to another (is not contagious). What increases the risk? This condition is more likely to develop when certain factors are present, such as: Heat and humidity. Sweating too much. Hormone changes, such as those that occur when taking birth control pills. Oily skin. A weak disease-fighting system (immunesystem). What are the signs or symptoms? Symptoms of this condition include: A rash of light or dark patches on your skin. The rash may have: Patches of tan or pink spots (on light skin). Patches of white or brown spots (on dark skin). Patches of skin that do not tan. Well-marked edges. Scales on the discolored areas. Mild itching. There may also be no itching. How is this diagnosed? A health care provider can usually  diagnose this condition by looking at your skin. During the exam, he or she may use ultraviolet (UV) light to see how much of your skin has been affected. In some cases, a skin sample may be taken by scraping the rash. This sample will be viewed under a microscope to check for yeast overgrowth. How is this treated? Treatment for this condition may include: Dandruff shampoo that is applied to the affected skin during showers or bathing. Over-the-counter medicated skin cream, lotion, or soaps. Prescription antifungal medicine in the form of skin cream or pills. Medicine to help reduce itching. Follow these instructions at home: Use over-the-counter and prescription medicines only as told by your health care provider. Apply dandruff shampoo to the affected area as told by your health care provider. Do not scratch the affected area of skin. Avoid hot and humid conditions. Do not use tanning booths. Try to avoid sweating a lot. Contact a health care provider if: Your symptoms get worse. You have a fever. You have signs of infection such as: Redness, swelling, or pain at the site of your rash. Warmth coming from your rash. Fluid or blood coming from your rash. Pus or a bad smell coming from your rash. Your rash comes back (recurs) after treatment. Your rash does not improve with treatment and spreads to other parts of the body. Summary Tinea versicolor is  a common fungal infection of the skin. It causes a rash that looks like light or dark patches on the skin. The rash most often occurs on the chest, back, neck, or upper arms. A health care provider can usually diagnose this condition by looking at your skin. Treatment may include applying shampoo to the skin and taking or applying medicines. This information is not intended to replace advice given to you by your health care provider. Make sure you discuss any questions you have with your health care provider. Document Revised: 07/01/2020  Document Reviewed: 07/01/2020 Elsevier Patient Education  2023 [employer].

## 2022-08-20 ENCOUNTER — Other Ambulatory Visit: Payer: Self-pay | Admitting: Registered Nurse

## 2022-08-20 ENCOUNTER — Encounter: Payer: Self-pay | Admitting: Registered Nurse

## 2022-08-20 DIAGNOSIS — E559 Vitamin D deficiency, unspecified: Secondary | ICD-10-CM

## 2022-08-20 DIAGNOSIS — D696 Thrombocytopenia, unspecified: Secondary | ICD-10-CM

## 2022-08-20 DIAGNOSIS — R7989 Other specified abnormal findings of blood chemistry: Secondary | ICD-10-CM

## 2022-08-20 LAB — CMP12+LP+TP+TSH+6AC+CBC/D/PLT
ALT: 23 IU/L (ref 0–44)
AST: 12 IU/L (ref 0–40)
Albumin/Globulin Ratio: 2.1 (ref 1.2–2.2)
Albumin: 4 g/dL (ref 3.8–4.9)
Alkaline Phosphatase: 69 IU/L (ref 44–121)
BUN/Creatinine Ratio: 16 (ref 9–20)
BUN: 19 mg/dL (ref 6–24)
Basophils Absolute: 0 10*3/uL (ref 0.0–0.2)
Basos: 0 %
Bilirubin Total: 0.5 mg/dL (ref 0.0–1.2)
Calcium: 8.6 mg/dL — ABNORMAL LOW (ref 8.7–10.2)
Chloride: 106 mmol/L (ref 96–106)
Chol/HDL Ratio: 3.7 ratio (ref 0.0–5.0)
Cholesterol, Total: 190 mg/dL (ref 100–199)
Creatinine, Ser: 1.17 mg/dL (ref 0.76–1.27)
EOS (ABSOLUTE): 0.1 10*3/uL (ref 0.0–0.4)
Eos: 1 %
Estimated CHD Risk: 0.6 times avg. (ref 0.0–1.0)
Free Thyroxine Index: 2 (ref 1.2–4.9)
GGT: 9 IU/L (ref 0–65)
Globulin, Total: 1.9 g/dL (ref 1.5–4.5)
Glucose: 109 mg/dL — ABNORMAL HIGH (ref 70–99)
HDL: 52 mg/dL (ref >39)
Hematocrit: 48.2 % (ref 37.5–51.0)
Hemoglobin: 16.2 g/dL (ref 13.0–17.7)
Immature Grans (Abs): 0 10*3/uL (ref 0.0–0.1)
Immature Granulocytes: 1 %
Iron: 83 ug/dL (ref 38–169)
LDH: 204 IU/L (ref 121–224)
LDL Chol Calc (NIH): 121 mg/dL — ABNORMAL HIGH (ref 0–99)
Lymphocytes Absolute: 1 10*3/uL (ref 0.7–3.1)
Lymphs: 22 %
MCH: 32.3 pg (ref 26.6–33.0)
MCHC: 33.6 g/dL (ref 31.5–35.7)
MCV: 96 fL (ref 79–97)
Monocytes Absolute: 0.5 10*3/uL (ref 0.1–0.9)
Monocytes: 11 %
Neutrophils Absolute: 3.1 10*3/uL (ref 1.4–7.0)
Neutrophils: 65 %
Phosphorus: 2.9 mg/dL (ref 2.8–4.1)
Platelets: 141 10*3/uL — ABNORMAL LOW (ref 150–450)
Potassium: 4.2 mmol/L (ref 3.5–5.2)
RBC: 5.01 x10E6/uL (ref 4.14–5.80)
RDW: 12.9 % (ref 11.6–15.4)
Sodium: 143 mmol/L (ref 134–144)
T3 Uptake Ratio: 26 % (ref 24–39)
T4, Total: 7.7 ug/dL (ref 4.5–12.0)
TSH: 1.99 u[IU]/mL (ref 0.450–4.500)
Total Protein: 5.9 g/dL — ABNORMAL LOW (ref 6.0–8.5)
Triglycerides: 91 mg/dL (ref 0–149)
Uric Acid: 6.9 mg/dL (ref 3.8–8.4)
VLDL Cholesterol Cal: 17 mg/dL (ref 5–40)
WBC: 4.8 10*3/uL (ref 3.4–10.8)
eGFR: 73 mL/min/{1.73_m2} (ref >59)

## 2022-08-20 LAB — HEMOGLOBIN A1C
Est. average glucose Bld gHb Est-mCnc: 111 mg/dL
Hgb A1c MFr Bld: 5.5 % (ref 4.8–5.6)

## 2022-08-20 LAB — VITAMIN D 25 HYDROXY (VIT D DEFICIENCY, FRACTURES): Vit D, 25-Hydroxy: 20.6 ng/mL — ABNORMAL LOW (ref 30.0–100.0)

## 2022-08-20 MED ORDER — CALCIUM CARBONATE ANTACID 400 MG PO CHEW: 2.0000 | CHEWABLE_TABLET | Freq: Two times a day (BID) | ORAL | 0 refills | Status: AC

## 2022-08-20 MED ORDER — CHOLECALCIFEROL 1.25 MG (50000 UT) PO TABS: 1.0000 | ORAL_TABLET | ORAL | 1 refills | Status: AC

## 2022-08-30 ENCOUNTER — Encounter: Payer: Self-pay | Admitting: Occupational Medicine

## 2022-08-30 ENCOUNTER — Telehealth: Payer: Self-pay | Admitting: Registered Nurse

## 2022-08-30 ENCOUNTER — Encounter: Payer: Self-pay | Admitting: Registered Nurse

## 2022-08-30 ENCOUNTER — Ambulatory Visit: Payer: Self-pay | Admitting: Occupational Medicine

## 2022-08-30 VITALS — BP 132/84 | Ht 71.0 in | Wt 215.0 lb

## 2022-08-30 DIAGNOSIS — R7989 Other specified abnormal findings of blood chemistry: Secondary | ICD-10-CM

## 2022-08-30 DIAGNOSIS — R110 Nausea: Secondary | ICD-10-CM

## 2022-08-30 DIAGNOSIS — E559 Vitamin D deficiency, unspecified: Secondary | ICD-10-CM

## 2022-08-30 DIAGNOSIS — Z0000 Encounter for general adult medical examination without abnormal findings: Secondary | ICD-10-CM

## 2022-08-30 DIAGNOSIS — D696 Thrombocytopenia, unspecified: Secondary | ICD-10-CM

## 2022-08-30 DIAGNOSIS — R112 Nausea with vomiting, unspecified: Secondary | ICD-10-CM

## 2022-08-30 MED ORDER — ONDANSETRON HCL 4 MG PO TABS: 4.0000 mg | ORAL_TABLET | Freq: Two times a day (BID) | ORAL | 0 refills | Status: AC | PRN

## 2022-08-30 NOTE — Progress Notes (Signed)
At [address] [employer] insurance premium discount evaluation: Met  Epic reviewed by RN Bess Kinds and transcribed labs.  Tobacco attestation signed. [facility] ROI formed signed. Forms placed in the chart.   Patient given handouts for [facility] pharmacies and discount drugs list,MyChart, Tele doc setup, Tele doc [address], [place] counseling and [employer] counseling.  What to do for infectious illness protocol. Given handout for list of medications that can be filled at [facility]. Given Clinic hours and Clinic Email.   Reviewed lab work and scheduled follow labs.   Patient reports not feeling well since arriving to work this am. Patient reports severe nausea. No vomiting or diarrhea, VVS. Patient sent home due to communicable disease policy. Will follow up in 24 hrs. Hr Supervisor made aware. Np made aware ordering Zofran for nausea.

## 2022-08-30 NOTE — Telephone Encounter (Unsigned)
RN Bess Kinds spoke with patient having some GI upset/fatigue sent home from work due to infectious disease employer policy and will re-evaluate tomorrow.  Patient requested medication to help with nausea.  Zofran 4mg  po BID prn n/v #6 RF0 sent to his pharmacy of choice.  Discussed with RN Bess Kinds can increase blood levels of Dovata so on lower than maximum dosing per epocrates and short usage only.

## 2022-08-31 NOTE — Telephone Encounter (Signed)
Patient reports doing better no nausea, able to eat and drinking. RTW 5/8 NP, HR and Supervisor made aware.

## 2022-09-02 ENCOUNTER — Other Ambulatory Visit: Payer: No Typology Code available for payment source

## 2022-09-11 NOTE — Telephone Encounter (Signed)
See lab results note 07/2022

## 2022-09-15 ENCOUNTER — Encounter: Payer: No Typology Code available for payment source | Admitting: Infectious Diseases

## 2022-10-05 ENCOUNTER — Other Ambulatory Visit: Payer: No Typology Code available for payment source | Admitting: Occupational Medicine

## 2022-10-05 DIAGNOSIS — R7989 Other specified abnormal findings of blood chemistry: Secondary | ICD-10-CM

## 2022-10-05 NOTE — Progress Notes (Signed)
Lab drawn from [facility] tolerated well no issues noted.   

## 2022-10-06 LAB — CALCIUM: Calcium: 8.9 mg/dL (ref 8.7–10.2)

## 2022-10-27 ENCOUNTER — Ambulatory Visit: Payer: No Typology Code available for payment source | Admitting: Infectious Diseases

## 2022-10-27 ENCOUNTER — Encounter: Payer: Self-pay | Admitting: Infectious Diseases

## 2022-10-27 VITALS — BP 125/82 | HR 110 | Temp 98.4°F | Ht 71.0 in | Wt 233.7 lb

## 2022-10-27 DIAGNOSIS — Z113 Encounter for screening for infections with a predominantly sexual mode of transmission: Secondary | ICD-10-CM

## 2022-10-27 DIAGNOSIS — E782 Mixed hyperlipidemia: Secondary | ICD-10-CM

## 2022-10-27 DIAGNOSIS — Z79899 Other long term (current) drug therapy: Secondary | ICD-10-CM | POA: Diagnosis not present

## 2022-10-27 DIAGNOSIS — B20 Human immunodeficiency virus [HIV] disease: Secondary | ICD-10-CM | POA: Diagnosis not present

## 2022-10-27 NOTE — Progress Notes (Signed)
   Subjective:    Patient ID: Barry Zhang, male  DOB: 1965/10/06, 57 y.o.        MRN: [id number]   HPI 57 yo M with hx of HIV+. He has been on dovato (07-2017 change from atripla started 09-2011). No problems with this.  He was seen 02-2018 with major depression.   Wt variable- doesn't like to exercise.  Has not had CD4 HIV RNA yet this year.    HIV 1 RNA Quant  Date Value  10/20/2020 Not Detected Copies/mL  01/22/2020 <20 Copies/mL  10/17/2018 <20 NOT DETECTED copies/mL   CD4 T Cell Abs (/uL)  Date Value  12/02/2021 366 (L)  10/20/2020 383 (L)  01/22/2020 488     Health Maintenance  Topic Date Due  . COVID-19 Vaccine (4 - 2023-24 season) 12/25/2021  . INFLUENZA VACCINE  11/25/2022  . DTaP/Tdap/Td (2 - Td or Tdap) 11/02/2025  . Colonoscopy  08/25/2031  . Hepatitis C Screening  Completed  . HIV Screening  Completed  . Zoster Vaccines- Shingrix  Completed  . HPV VACCINES  Aged Out   The 10-year ASCVD risk score (Arnett DK, et al., 2019) is: 7.3%   Values used to calculate the score:     Age: 73 years     Sex: Male     Is Non-Hispanic African American: No     Diabetic: No     Tobacco smoker: No     Systolic Blood Pressure: 142 mmHg     Is BP treated: No     HDL Cholesterol: 52 mg/dL     Total Cholesterol: 190 mg/dL    Review of Systems  Constitutional:  Negative for chills, fever and weight loss.  Respiratory:  Negative for cough and shortness of breath.   Gastrointestinal:  Negative for constipation and diarrhea.  Genitourinary:  Negative for dysuria.  Psychiatric/Behavioral:  Negative for depression. The patient does not have insomnia.     Please see HPI. All other systems reviewed and negative.     Objective:  Physical Exam Vitals reviewed.  Constitutional:      Appearance: Normal appearance.  HENT:     Mouth/Throat:     Mouth: Mucous membranes are moist.     Pharynx: No oropharyngeal exudate.  Eyes:     Extraocular Movements: Extraocular  movements intact.     Pupils: Pupils are equal, round, and reactive to light.  Cardiovascular:     Rate and Rhythm: Normal rate and regular rhythm.  Pulmonary:     Effort: Pulmonary effort is normal.     Breath sounds: Normal breath sounds.  Abdominal:     General: Bowel sounds are normal. There is no distension.     Palpations: Abdomen is soft.     Tenderness: There is no abdominal tenderness.  Musculoskeletal:        General: Normal range of motion.     Cervical back: Normal range of motion and neck supple.     Right lower leg: No edema.     Left lower leg: No edema.  Neurological:     General: No focal deficit present.     Mental Status: He is alert.  Psychiatric:        Mood and Affect: Mood normal.          Assessment & Plan:

## 2022-10-27 NOTE — Assessment & Plan Note (Signed)
Defers statin Will continue to watch.

## 2022-10-27 NOTE — Assessment & Plan Note (Addendum)
He is doing well Will f/u with pharm regarding cabaneuva.  Offered/refused condoms.  Will see him back in 1 year or sooner if he gets started on cabaneuva.  Will get labs at work.  I repeated his BP manually 125/82

## 2022-11-03 ENCOUNTER — Ambulatory Visit
Admission: RE | Admit: 2022-11-03 | Discharge: 2022-11-03 | Disposition: A | Discharge status: Home / Self Care | Payer: No Typology Code available for payment source | Source: Ambulatory Visit | Attending: Internal Medicine | Admitting: Internal Medicine

## 2022-11-03 ENCOUNTER — Other Ambulatory Visit: Payer: Self-pay | Admitting: Internal Medicine

## 2022-11-03 DIAGNOSIS — R109 Unspecified abdominal pain: Secondary | ICD-10-CM

## 2022-11-15 ENCOUNTER — Other Ambulatory Visit: Payer: Self-pay | Admitting: Registered Nurse

## 2022-11-15 DIAGNOSIS — E559 Vitamin D deficiency, unspecified: Secondary | ICD-10-CM

## 2022-11-15 MED ORDER — VITAMIN D3 50 MCG (2000 UT) PO TABS
2000.0000 [IU] | ORAL_TABLET | Freq: Every day | ORAL | 3 refills | Status: AC
  Filled 2022-11-15 – 2022-11-16 (×2): qty 100, 100d supply, fill #0

## 2022-11-15 NOTE — Progress Notes (Signed)
My chart message sent to patient  Barry Zhang,  Per new guidelines I recommend taking 2000 units vitamin D/cholecalciferol with meal daily after you have finished the 50,000 units once a week and no further testing indicated at this time.  Please let me know if you have further questions Rx sent to your pharmacy for the lower dose vitamin D today.  Sincerely,  Albina Billet NP-C

## 2022-11-16 ENCOUNTER — Other Ambulatory Visit (HOSPITAL_COMMUNITY): Payer: Self-pay

## 2022-11-17 ENCOUNTER — Telehealth: Payer: Self-pay | Admitting: Registered Nurse

## 2022-11-17 ENCOUNTER — Encounter: Payer: Self-pay | Admitting: Registered Nurse

## 2022-11-17 NOTE — Telephone Encounter (Signed)
Patient had handwritten Rx from [facility] for RNA RPR monograph reflex to genotype, HIV I RNA altru quant reflex to genotype and t helper CD4 count.  [facility] lab had closed and he was unable to get drawn at [facility] office.  He stated he hasn't had these drawn at [facility] clinic since Nurse Okey Regal left 8 years ago.  Wondering if they can be drawn today.  Reviewed [facility] requirements for [id number], [id number] and [id number] required yellow tube and special foam packaging that clinic does not have and freezing after collection.  Discussed with patient we do not have yellow tubes/flash freezer and recommended that he contact [facility] office to schedule appt for this lab draw  Patient verbalized understanding information/instructions and had no further questions at that time.  Patient would like to know if clinic draw in the future discussed would check prices with supervisor and HR team to see if feasible per budget.

## 2022-11-18 ENCOUNTER — Other Ambulatory Visit (HOSPITAL_COMMUNITY): Payer: Self-pay

## 2022-11-19 ENCOUNTER — Other Ambulatory Visit (HOSPITAL_COMMUNITY): Payer: Self-pay

## 2022-11-19 NOTE — Telephone Encounter (Signed)
RN Rosalita Chessman my supervisor notified HR of cost for requested labs and awaiting approval or disapproval from HR Ayah to draw in clinic.  Patient may still go to [facility] and have drawn/billed to insurance at this time.

## 2022-11-22 ENCOUNTER — Other Ambulatory Visit (HOSPITAL_COMMUNITY): Payer: Self-pay

## 2022-11-24 ENCOUNTER — Other Ambulatory Visit (HOSPITAL_COMMUNITY): Payer: Self-pay

## 2022-11-25 ENCOUNTER — Telehealth: Payer: Self-pay

## 2022-11-25 ENCOUNTER — Other Ambulatory Visit (HOSPITAL_COMMUNITY): Payer: Self-pay

## 2022-11-25 NOTE — Telephone Encounter (Signed)
Pharmacy Patient Advocate Encounter   Received notification from  PROVIDER  that prior authorization for CABENUVA is required/requested.  PA required; PA submitted to [employer] via Prompt PA Key/confirmation #/EOC [id number]. Status is pending

## 2022-11-26 NOTE — Telephone Encounter (Signed)
Patient notified by RN Kimrey HR [facility] he was to continue having labs drawn at [facility] office.  HR Tonya notified clinic not approved for adding these labs for clinic draws at [facility].  Supervisor [facility] Oda Cogan notified of request and HR decision also and verified with clinic staff they will not be drawn at [facility].Marland Kitchen

## 2022-12-01 NOTE — Telephone Encounter (Signed)
Pharmacy Patient Advocate Encounter  Received notification from [employer] that Prior Authorization for CABENUVA has been denied.  Per insurance:    Hoping I attached the correct documentation! Let me know if I need to appeal and resend corrected information!

## 2022-12-21 ENCOUNTER — Encounter: Payer: Self-pay | Admitting: Registered Nurse

## 2022-12-21 ENCOUNTER — Ambulatory Visit: Payer: No Typology Code available for payment source | Admitting: Registered Nurse

## 2022-12-21 VITALS — BP 112/78 | HR 90 | Temp 98.6°F

## 2022-12-21 DIAGNOSIS — L237 Allergic contact dermatitis due to plants, except food: Secondary | ICD-10-CM

## 2022-12-21 MED ORDER — PREDNISONE 10 MG PO TABS: ORAL_TABLET | ORAL | Status: AC

## 2022-12-21 NOTE — Patient Instructions (Signed)
Poison Ivy Dermatitis Poison ivy dermatitis is irritation and swelling (inflammation) of the skin caused by chemicals in the leaves of the poison ivy plant. The skin reaction often involves redness, blisters, and extreme itching. What are the causes? This condition is caused by a chemical (urushiol) found in the sap of the poison ivy plant. This chemical is sticky and can easily spread to people, animals, and objects. You can get poison ivy dermatitis by: Having direct contact with a poison ivy plant. Touching animals, other people, or objects that have come in contact with poison ivy and have the chemical on them. What increases the risk? This condition is more likely to develop in people who: Are outdoors often in wooded or marshy areas. Go outdoors without wearing protective clothing, such as closed shoes, long pants, and a long-sleeved shirt. What are the signs or symptoms? Symptoms of this condition include: Redness of the skin. Extreme itching. A rash that often includes bumps and blisters. The rash usually appears 48 hours after exposure, if you have been exposed before. If this is the first time you have been exposed, the rash may not appear until a week after exposure. Swelling. This may occur if the reaction is more severe. Symptoms usually last for 1-2 weeks. However, the first time you develop this condition, symptoms may last 3-4 weeks. How is this diagnosed? This condition may be diagnosed based on your symptoms and a physical exam. Your health care provider may also ask you about any recent outdoor activity. How is this treated? Treatment for this condition will vary depending on how severe it is. Treatment may include: Hydrocortisone cream or calamine lotion to relieve itching. Oatmeal baths to soothe the skin. Medicines, such as over-the-counter antihistamine tablets. Oral or injected steroid medicine, for more severe reactions. Follow these instructions at  home: Medicines Take or apply over-the-counter and prescription medicines only as told by your health care provider. Use hydrocortisone cream or calamine lotion as needed to soothe the skin and relieve itching. General instructions Do not scratch or rub your skin. Apply a cold, wet cloth (cold compress) to the affected areas or take baths in cool water. This will help with itching. Avoid hot baths and showers. Take oatmeal baths as needed. Use colloidal oatmeal. You can get this at your local pharmacy or grocery store. Follow the instructions on the packaging. Wash all clothes, bedsheets, towels, and blankets you were in contact with between your exposure and appearance of the rash. Check the affected area every day for signs of infection. Check for: More redness, swelling, or pain. Fluid or blood. Warmth. Pus or a bad smell. Keep all follow-up visits. Your health care provider may want to see how your skin is progressing with treatment. How is this prevented?  Learn to identify the poison ivy plant and avoid contact with the plant. This plant can be recognized by the number of leaves. Generally, poison ivy has three leaves with flowering branches on a single stem. The leaves are typically glossy, and they have jagged edges that come to a point. If you have been exposed to poison ivy, thoroughly wash with soap and water right away. You have about 30 minutes to remove the plant resin before it will cause the rash. Be sure to wash under your fingernails, because any plant resin there will continue to spread the rash. When hiking or camping, wear clothes that will help you to avoid skin exposure. This includes long pants, a long-sleeved shirt, long socks,   and hiking boots. You can also apply preventive lotion to your skin to help limit exposure. If you suspect that your clothes or outdoor gear came in contact with poison ivy, rinse them off outside with a garden hose before you bring them inside  your house. When doing yard work or gardening, wear gloves, long sleeves, long pants, and boots. Wash your garden tools and gloves if they come in contact with poison ivy. If you suspect that your pet has come into contact with poison ivy, wash them with pet shampoo and water. Make sure to wear gloves while washing your pet. Contact a health care provider if: You have open sores in the rash area. You have any signs of infection. You have redness that spreads beyond the rash area. You have a fever. You have a rash over a large area of your body. You have a rash on your eyes, mouth, or genitals. You have a rash that does not improve after a few weeks. Get help right away if: Your face swells or your eyes swell shut. You have trouble breathing. You have trouble swallowing. These symptoms may be an emergency. Get help right away. Call 911. Do not wait to see if the symptoms will go away. Do not drive yourself to the hospital. This information is not intended to replace advice given to you by your health care provider. Make sure you discuss any questions you have with your health care provider. Document Revised: 09/10/2021 Document Reviewed: 09/10/2021 Elsevier Patient Education  2024 Elsevier Inc.  

## 2022-12-21 NOTE — Progress Notes (Signed)
Subjective:    Patient ID: Barry Zhang, male    DOB: 1965-10-15, 57 y.o.   MRN: [id number]  57y/o caucasian male established patient here for evaluation worsening rash after working in his yard.  Poison ivy again.  Required oral prednisone last time for large surface area reaction.  Denied infection e.g. purulent discharge, fever/chills.  Rash red dry very itchy started on arms now chest, anterior neck and face also.  Legs spared for most part except a couple small spots.  Patient applying OTC anti itch lotion and ice.  Helping some with itching but rash spreading.  Denied difficulty breathing/wheezing/throat swelling./n/v/d.      Review of Systems  Constitutional:  Negative for chills, diaphoresis and fever.  HENT:  Negative for trouble swallowing and voice change.   Eyes:  Negative for photophobia and visual disturbance.  Respiratory:  Negative for cough, choking, chest tightness, shortness of breath, wheezing and stridor.   Cardiovascular:  Negative for chest pain and leg swelling.  Gastrointestinal:  Negative for diarrhea, nausea and vomiting.  Genitourinary:  Negative for difficulty urinating.  Musculoskeletal:  Negative for gait problem, neck pain and neck stiffness.  Skin:  Positive for color change and rash. Negative for pallor and wound.  Neurological:  Negative for dizziness, tremors, seizures, syncope, facial asymmetry, speech difficulty, weakness, light-headedness, numbness and headaches.  Hematological:  Negative for adenopathy.  Psychiatric/Behavioral:  Negative for agitation, confusion and sleep disturbance.        Objective:   Physical Exam Vitals and nursing note reviewed.  Constitutional:      General: He is awake. He is not in acute distress.    Appearance: Normal appearance. He is well-developed, well-groomed and overweight. He is not ill-appearing, toxic-appearing or diaphoretic.  HENT:     Head: Normocephalic and atraumatic.     Jaw: There is normal jaw  occlusion.     Salivary Glands: Right salivary gland is not diffusely enlarged. Left salivary gland is not diffusely enlarged.     Right Ear: Hearing and external ear normal.     Left Ear: Hearing and external ear normal.     Nose: Nose normal. No congestion or rhinorrhea.     Mouth/Throat:     Lips: Pink. No lesions.     Mouth: Mucous membranes are moist. No oral lesions or angioedema.     Dentition: No gum lesions.     Tongue: No lesions. Tongue does not deviate from midline.     Palate: No mass and lesions.     Pharynx: Oropharynx is clear. Uvula midline. No oropharyngeal exudate, uvula swelling or postnasal drip.     Tonsils: No tonsillar exudate or tonsillar abscesses.  Eyes:     General: Lids are normal. Vision grossly intact. Gaze aligned appropriately. Allergic shiner present. No scleral icterus.       Right eye: No discharge.        Left eye: No discharge.     Extraocular Movements: Extraocular movements intact.     Conjunctiva/sclera: Conjunctivae normal.     Pupils: Pupils are equal, round, and reactive to light.  Neck:     Trachea: Trachea and phonation normal.  Cardiovascular:     Rate and Rhythm: Normal rate and regular rhythm.     Pulses: Normal pulses.          Radial pulses are 2+ on the right side and 2+ on the left side.  Pulmonary:     Effort: Pulmonary effort is normal. No respiratory  distress.     Breath sounds: Normal breath sounds and air entry. No stridor or transmitted upper airway sounds. No wheezing, rhonchi or rales.     Comments: Spoke full sentences without difficulty; no cough observed in exam room Abdominal:     General: Abdomen is flat.  Musculoskeletal:        General: No deformity. Normal range of motion.     Right forearm: Swelling present. No deformity, lacerations, tenderness or bony tenderness.     Left forearm: Swelling present. No deformity, lacerations, tenderness or bony tenderness.     Right hand: Normal strength. Normal capillary  refill.     Left hand: Normal strength. Normal capillary refill.     Cervical back: Normal range of motion and neck supple. No swelling, edema, deformity, erythema, signs of trauma, lacerations, rigidity, spasms, torticollis, tenderness or crepitus. No pain with movement. Normal range of motion.     Thoracic back: No swelling, edema, deformity, signs of trauma, lacerations, spasms or tenderness. Normal range of motion.     Right lower leg: No edema.     Left lower leg: No edema.  Lymphadenopathy:     Head:     Right side of head: No submandibular or preauricular adenopathy.     Left side of head: No submandibular or preauricular adenopathy.     Cervical:     Right cervical: No superficial cervical adenopathy.    Left cervical: No superficial cervical adenopathy.  Skin:    General: Skin is warm and dry.     Capillary Refill: Capillary refill takes less than 2 seconds.     Coloration: Skin is not ashen, cyanotic, jaundiced, mottled, pale or sallow.     Findings: Abrasion, erythema and rash present. No abscess, acne, bruising, burn, ecchymosis, signs of injury, laceration, lesion, petechiae or wound. Rash is macular, papular and scaling. Rash is not crusting, nodular, purpuric, pustular, urticarial or vesicular.     Nails: There is no clubbing.          Comments: Macular erythema with scattered papules bilateral arms; faces with macular erythema between eyebrows and fine scaling/flaking of skin; anterior neck and chest/abdomen with macular erythema dry; noted patient itching in exam room bilateral arms; papules right forearm abraded dry no bleeding  Neurological:     General: No focal deficit present.     Mental Status: He is alert and oriented to person, place, and time. Mental status is at baseline.     GCS: GCS eye subscore is 4. GCS verbal subscore is 5. GCS motor subscore is 6.     Cranial Nerves: Cranial nerves 2-12 are intact. No cranial nerve deficit, dysarthria or facial asymmetry.      Motor: Motor function is intact. No weakness, tremor, atrophy, abnormal muscle tone or seizure activity.     Coordination: Coordination is intact. Coordination normal.     Gait: Gait is intact. Gait normal.     Comments: In/out of chair and on/off exam table without difficulty; gait sure and steady in clinic; bilateral hand grasp equal 5/5  Psychiatric:        Attention and Perception: Attention and perception normal.        Mood and Affect: Mood and affect normal.        Speech: Speech normal.        Behavior: Behavior normal. Behavior is cooperative.        Thought Content: Thought content normal.        Cognition and  Memory: Cognition and memory normal.        Judgment: Judgment normal.           Assessment & Plan:   A-poison ivy   P-Has had poison ivy/oak in the past worsening with each subsequent exposure.  Typically 60mg  taper over 2 weeks sufficient.  Dispensed prednisone 2 days each 60/50/40/30/20/10mg  take with breakfast po daily #42 RF0 from [facility] to patient.  Widespread greater than 20% body surface area.  1-2mg /kg Prednisone (max 60mg ) for 7-10 days and taper over next 7-10 days per Up to Date.  Symptomatic therapy suggested e.g. Calagel, emollient, benadryl or OTC zyrtec 10mg  po BID prn.  Warm to cool water soaks and/or oatmeal baths. Avoid hot/steamy showers.  Claritin available in clinic patient going to trial am claritin 10mg  and qhs benadryl 25mg  po until prednisone taper has helped.  Right now benadryl, ice, and cocoa butter helping the most for symptoms.   Call or return to clinic as needed if these symptoms worsen or fail to improve as anticipated especially lesions noted on eye, visual changes or visual loss. Avoid scratching lesions to prevent secondary infections.  May apply ice to itchy areas if po/topical meds not yet active systemically or wearing off prior to next dose.  Exitcare handout on poison ivy.  Patient verbalized agreement and understanding of treatment  plan and had no further questions at this time.

## 2022-12-24 ENCOUNTER — Other Ambulatory Visit (HOSPITAL_COMMUNITY): Payer: Self-pay

## 2022-12-24 NOTE — Telephone Encounter (Signed)
He will need updated labs for his RNA (within the last year).  Once complete, an appeal can be filed within 180 days from 11/25/22.

## 2022-12-31 ENCOUNTER — Telehealth: Payer: Self-pay | Admitting: Infectious Diseases

## 2022-12-31 DIAGNOSIS — Z79899 Other long term (current) drug therapy: Secondary | ICD-10-CM

## 2022-12-31 DIAGNOSIS — Z113 Encounter for screening for infections with a predominantly sexual mode of transmission: Secondary | ICD-10-CM

## 2022-12-31 DIAGNOSIS — B20 Human immunodeficiency virus [HIV] disease: Secondary | ICD-10-CM

## 2022-12-31 NOTE — Telephone Encounter (Signed)
Called pt (and left VM) to update him about his cabaneuva application.  He needs repeat labs.  Will order,  Asked him to call back to clinic to sched appt.

## 2023-01-03 NOTE — Telephone Encounter (Signed)
Look like dr hatcher already placed an order for updated labs, could someone call him and let him know he needs to get the HIV RNA test completed to get cabanuva approved.

## 2023-01-04 NOTE — Telephone Encounter (Signed)
Called pt - no answer; left message to call the office for more details and to schedule a lab appt.

## 2023-01-12 ENCOUNTER — Other Ambulatory Visit: Payer: No Typology Code available for payment source

## 2023-01-12 ENCOUNTER — Telehealth: Payer: Self-pay

## 2023-01-12 ENCOUNTER — Other Ambulatory Visit (HOSPITAL_COMMUNITY)
Admission: RE | Admit: 2023-01-12 | Discharge: 2023-01-12 | Disposition: A | Discharge status: Home / Self Care | Payer: No Typology Code available for payment source | Source: Ambulatory Visit | Attending: Infectious Diseases | Admitting: Infectious Diseases

## 2023-01-12 DIAGNOSIS — Z113 Encounter for screening for infections with a predominantly sexual mode of transmission: Secondary | ICD-10-CM | POA: Insufficient documentation

## 2023-01-12 DIAGNOSIS — Z79899 Other long term (current) drug therapy: Secondary | ICD-10-CM

## 2023-01-12 DIAGNOSIS — B20 Human immunodeficiency virus [HIV] disease: Secondary | ICD-10-CM

## 2023-01-12 NOTE — Addendum Note (Signed)
Addended by: Bufford Spikes on: 01/12/2023 12:07 PM   Modules accepted: Orders

## 2023-01-12 NOTE — Telephone Encounter (Signed)
good morning Dr Ninetta Lights ... pt did call back in regards to getting his labs drawn but their not st as future ... can you verify that the labs that were down down on 9/6 is the labs you need tracey to do so I can go ahead and make him his appt ... thanks in advance

## 2023-01-12 NOTE — Addendum Note (Signed)
Addended by: Bufford Spikes on: 01/12/2023 04:47 PM   Modules accepted: Orders

## 2023-01-13 LAB — URINE CYTOLOGY ANCILLARY ONLY
Chlamydia: NEGATIVE
Comment: NEGATIVE
Comment: NORMAL
Neisseria Gonorrhea: NEGATIVE

## 2023-01-14 ENCOUNTER — Other Ambulatory Visit: Payer: No Typology Code available for payment source

## 2023-01-14 ENCOUNTER — Telehealth: Payer: Self-pay | Admitting: *Deleted

## 2023-01-14 LAB — COMPREHENSIVE METABOLIC PANEL
ALT: 26 IU/L (ref 0–44)
AST: 19 IU/L (ref 0–40)
Albumin: 4 g/dL (ref 3.8–4.9)
Alkaline Phosphatase: 80 IU/L (ref 44–121)
BUN/Creatinine Ratio: 17 (ref 9–20)
BUN: 19 mg/dL (ref 6–24)
Bilirubin Total: 0.4 mg/dL (ref 0.0–1.2)
CO2: 20 mmol/L (ref 20–29)
Calcium: 9.1 mg/dL (ref 8.7–10.2)
Chloride: 110 mmol/L — ABNORMAL HIGH (ref 96–106)
Creatinine, Ser: 1.14 mg/dL (ref 0.76–1.27)
Globulin, Total: 2.1 g/dL (ref 1.5–4.5)
Glucose: 98 mg/dL (ref 70–99)
Potassium: 4.1 mmol/L (ref 3.5–5.2)
Sodium: 144 mmol/L (ref 134–144)
Total Protein: 6.1 g/dL (ref 6.0–8.5)
eGFR: 75 mL/min/{1.73_m2} (ref >59)

## 2023-01-14 LAB — CBC
Hematocrit: 47.8 % (ref 37.5–51.0)
Hemoglobin: 15.8 g/dL (ref 13.0–17.7)
MCH: 31.5 pg (ref 26.6–33.0)
MCHC: 33.1 g/dL (ref 31.5–35.7)
MCV: 95 fL (ref 79–97)
Platelets: 126 10*3/uL — ABNORMAL LOW (ref 150–450)
RBC: 5.01 x10E6/uL (ref 4.14–5.80)
RDW: 12.4 % (ref 11.6–15.4)
WBC: 3.9 10*3/uL (ref 3.4–10.8)

## 2023-01-14 LAB — LIPID PANEL
Chol/HDL Ratio: 4 ratio (ref 0.0–5.0)
Cholesterol, Total: 200 mg/dL — ABNORMAL HIGH (ref 100–199)
HDL: 50 mg/dL (ref >39)
LDL Chol Calc (NIH): 128 mg/dL — ABNORMAL HIGH (ref 0–99)
Triglycerides: 123 mg/dL (ref 0–149)
VLDL Cholesterol Cal: 22 mg/dL (ref 5–40)

## 2023-01-14 LAB — T-HELPER CELLS (CD4) COUNT (NOT AT ARMC)
CD4 % Helper T Cell: 34 % (ref 33–65)
CD4 T Cell Abs: 324 /uL — ABNORMAL LOW (ref 400–1790)

## 2023-01-14 LAB — HIV-1 RNA QUANT-NO REFLEX-BLD
HIV-1 RNA Viral Load Log: 2.146 log10copy/mL
HIV-1 RNA Viral Load: 140 copies/mL

## 2023-01-15 LAB — RPR: RPR Ser Ql: REACTIVE — AB

## 2023-01-15 LAB — RPR, QUANT+TP ABS (REFLEX)
Rapid Plasma Reagin, Quant: 1:1 {titer} — ABNORMAL HIGH
T Pallidum Abs: REACTIVE — AB

## 2023-01-17 NOTE — Telephone Encounter (Signed)
Called pt back about his labs  Barry Zhang- is he eligible for cabaneuva?  Thanks jeff

## 2023-01-18 NOTE — Telephone Encounter (Signed)
Sorry correction, per insurance patient will need updated RNA Quant Log. Last update was 09/2020.   Was told to use the Quant RNA which must be less than 50 copies/ML

## 2023-01-19 ENCOUNTER — Other Ambulatory Visit (HOSPITAL_COMMUNITY): Payer: Self-pay

## 2023-02-16 ENCOUNTER — Other Ambulatory Visit: Payer: Self-pay

## 2023-03-22 ENCOUNTER — Other Ambulatory Visit: Payer: Self-pay | Admitting: Infectious Diseases

## 2023-03-22 DIAGNOSIS — B20 Human immunodeficiency virus [HIV] disease: Secondary | ICD-10-CM

## 2023-03-23 ENCOUNTER — Encounter: Payer: Self-pay | Admitting: Infectious Diseases

## 2023-09-26 ENCOUNTER — Other Ambulatory Visit: Payer: Self-pay

## 2023-09-26 VITALS — BP 123/93 | Ht 69.0 in | Wt 236.0 lb

## 2023-09-26 DIAGNOSIS — Z0000 Encounter for general adult medical examination without abnormal findings: Secondary | ICD-10-CM

## 2023-09-26 NOTE — Progress Notes (Signed)
 [facility] labs.

## 2023-09-27 ENCOUNTER — Ambulatory Visit: Payer: Self-pay | Admitting: Registered Nurse

## 2023-09-27 DIAGNOSIS — Z0000 Encounter for general adult medical examination without abnormal findings: Secondary | ICD-10-CM

## 2023-09-27 LAB — CMP12+LP+TP+TSH+6AC+PSA+CBC…
ALT: 31 IU/L (ref 0–44)
AST: 29 IU/L (ref 0–40)
Albumin: 4.5 g/dL (ref 3.8–4.9)
Alkaline Phosphatase: 87 IU/L (ref 44–121)
BUN/Creatinine Ratio: 14 (ref 9–20)
BUN: 16 mg/dL (ref 6–24)
Basophils Absolute: 0 10*3/uL (ref 0.0–0.2)
Basos: 1 %
Bilirubin Total: 0.6 mg/dL (ref 0.0–1.2)
Calcium: 9.6 mg/dL (ref 8.7–10.2)
Chloride: 107 mmol/L — ABNORMAL HIGH (ref 96–106)
Chol/HDL Ratio: 4.3 ratio (ref 0.0–5.0)
Cholesterol, Total: 189 mg/dL (ref 100–199)
Creatinine, Ser: 1.17 mg/dL (ref 0.76–1.27)
EOS (ABSOLUTE): 0.1 10*3/uL (ref 0.0–0.4)
Eos: 2 %
Estimated CHD Risk: 0.8 times avg. (ref 0.0–1.0)
Free Thyroxine Index: 2.2 (ref 1.2–4.9)
GGT: 10 IU/L (ref 0–65)
Globulin, Total: 2.1 g/dL (ref 1.5–4.5)
Glucose: 104 mg/dL — ABNORMAL HIGH (ref 70–99)
HDL: 44 mg/dL (ref >39)
Hematocrit: 50.5 % (ref 37.5–51.0)
Hemoglobin: 16.5 g/dL (ref 13.0–17.7)
Immature Grans (Abs): 0 10*3/uL (ref 0.0–0.1)
Immature Granulocytes: 0 %
Iron: 105 ug/dL (ref 38–169)
LDH: 233 IU/L — ABNORMAL HIGH (ref 121–224)
LDL Chol Calc (NIH): 129 mg/dL — ABNORMAL HIGH (ref 0–99)
Lymphocytes Absolute: 1.1 10*3/uL (ref 0.7–3.1)
Lymphs: 28 %
MCH: 32 pg (ref 26.6–33.0)
MCHC: 32.7 g/dL (ref 31.5–35.7)
MCV: 98 fL — ABNORMAL HIGH (ref 79–97)
Monocytes Absolute: 0.5 10*3/uL (ref 0.1–0.9)
Monocytes: 12 %
Neutrophils Absolute: 2.1 10*3/uL (ref 1.4–7.0)
Neutrophils: 57 %
Phosphorus: 3.4 mg/dL (ref 2.8–4.1)
Platelets: 151 10*3/uL (ref 150–450)
Potassium: 4.7 mmol/L (ref 3.5–5.2)
Prostate Specific Ag, Serum: 0.6 ng/mL (ref 0.0–4.0)
RBC: 5.15 x10E6/uL (ref 4.14–5.80)
RDW: 12.9 % (ref 11.6–15.4)
Sodium: 144 mmol/L (ref 134–144)
T3 Uptake Ratio: 25 % (ref 24–39)
T4, Total: 8.9 ug/dL (ref 4.5–12.0)
TSH: 1.26 u[IU]/mL (ref 0.450–4.500)
Total Protein: 6.6 g/dL (ref 6.0–8.5)
Triglycerides: 88 mg/dL (ref 0–149)
Uric Acid: 7.3 mg/dL (ref 3.8–8.4)
VLDL Cholesterol Cal: 16 mg/dL (ref 5–40)
WBC: 3.8 10*3/uL (ref 3.4–10.8)
eGFR: 73 mL/min/{1.73_m2} (ref >59)

## 2023-09-27 LAB — HEMOGLOBIN A1C
Est. average glucose Bld gHb Est-mCnc: 111 mg/dL
Hgb A1c MFr Bld: 5.5 % (ref 4.8–5.6)

## 2023-10-10 ENCOUNTER — Other Ambulatory Visit: Payer: Self-pay | Admitting: Infectious Diseases

## 2023-10-10 DIAGNOSIS — B20 Human immunodeficiency virus [HIV] disease: Secondary | ICD-10-CM

## 2023-10-10 MED ORDER — DOVATO 50-300 MG PO TABS
1.0000 | ORAL_TABLET | Freq: Every day | ORAL | 1 refills | Status: DC
Start: 2023-10-10 — End: 2024-03-06

## 2023-10-10 NOTE — Telephone Encounter (Signed)
 Copied from CRM [id number]. Topic: Clinical - Medication Refill >> Oct 10, 2023 10:53 AM Carrielelia G wrote: Medication: DOVATO  50-300 MG tablet  Has the patient contacted their pharmacy? Yes (Agent: If no, request that the patient contact the pharmacy for the refill. If patient does not wish to contact the pharmacy document the reason why and proceed with request.) (Agent: If yes, when and what did the pharmacy advise?)  This is the patient's preferred pharmacy:   [id number] - [place], [place] - [address] AT [facility] OF [address] [place] [address] Phone: [phone] Fax: [phone]  Is this the correct pharmacy for this prescription? Yes If no, delete pharmacy and type the correct one.    Is the patient out of the medication? No  Has the patient been seen for an appointment in the last year OR does the patient have an upcoming appointment? Yes  Can we respond through MyChart? Yes  Agent: Please be advised that Rx refills may take up to 3 business days. We ask that you follow-up with your pharmacy.

## 2023-11-02 ENCOUNTER — Encounter: Payer: Self-pay | Admitting: Registered Nurse

## 2023-11-02 ENCOUNTER — Telehealth: Payer: Self-pay | Admitting: Registered Nurse

## 2023-11-02 DIAGNOSIS — E860 Dehydration: Secondary | ICD-10-CM

## 2023-11-02 NOTE — Telephone Encounter (Signed)
 Patient seen by RN Asberry Budge [employer] 7/7 given tylenol  325mg  2 tabs UD from clinic stock for headache BP 124/78 T 97.7 tympanic HR 75 RR 18.  Patient notified RN He may be leaving work today.  NP attempted to reach patient via telephone no answer left voicemail

## 2023-11-03 NOTE — Telephone Encounter (Signed)
 Patient reported he drank 3 bottles of gatorade at home after leaving work early.  RN Asberry told him probably dehydrated causing headache as he has been exercising outside.  Patient stated he woke up later in the afternoon and headache resolved had not returned feeling well and denied concerns today at work.  A&Ox3 skin warm dry and pink respirations even and unlabored RA gait sure and steady  He returned to work Tuesday 11/01/23 and has not have any further questions or concerns.

## 2024-03-06 ENCOUNTER — Other Ambulatory Visit: Payer: Self-pay

## 2024-03-06 DIAGNOSIS — B20 Human immunodeficiency virus [HIV] disease: Secondary | ICD-10-CM

## 2024-03-06 MED ORDER — DOVATO 50-300 MG PO TABS: 1.0000 | ORAL_TABLET | Freq: Every day | ORAL | 1 refills | Status: AC

## 2024-03-06 NOTE — Telephone Encounter (Signed)
 Appointment has been scheduled.

## 2024-03-20 ENCOUNTER — Ambulatory Visit: Admitting: Infectious Diseases

## 2024-03-20 VITALS — BP 127/88 | HR 75 | Temp 98.4°F | Ht 69.0 in | Wt 237.6 lb

## 2024-03-20 DIAGNOSIS — B20 Human immunodeficiency virus [HIV] disease: Secondary | ICD-10-CM | POA: Diagnosis not present

## 2024-03-20 DIAGNOSIS — R600 Localized edema: Secondary | ICD-10-CM | POA: Diagnosis not present

## 2024-03-20 DIAGNOSIS — Z23 Encounter for immunization: Secondary | ICD-10-CM | POA: Diagnosis not present

## 2024-03-20 DIAGNOSIS — Z79899 Other long term (current) drug therapy: Secondary | ICD-10-CM | POA: Diagnosis not present

## 2024-03-20 DIAGNOSIS — Z113 Encounter for screening for infections with a predominantly sexual mode of transmission: Secondary | ICD-10-CM

## 2024-03-20 NOTE — Assessment & Plan Note (Addendum)
 Encouraged pt to lose wt Encouraged pt to wear compression hose Encouraged pt to keep feet elevated Asked him to call if worsening and we will get doppler, ABI (since resolves at night now, will hold on this).

## 2024-03-20 NOTE — Progress Notes (Signed)
   Subjective:    Patient ID: Barry Zhang, male  DOB: 08/24/1965, 58 y.o.        MRN: [id number]   HPI 58 yo M with hx of HIV+. He has been on dovato  (07-2017 change from atripla  started 09-2011). No problems with this.  He was seen 02-2018 with major depression.    Wt up- hasn't been back to gym.  He states he was approved for Cabaneuva, wants to know if he can start? If he has copays?   Would like wegovy....  HIV 1 RNA Quant  Date Value  10/20/2020 Not Detected Copies/mL  01/22/2020 <20 Copies/mL  10/17/2018 <20 NOT DETECTED copies/mL   HIV-1 RNA Viral Load (copies/mL)  Date Value  01/12/2023 140   CD4 T Cell Abs (/uL)  Date Value  01/12/2023 324 (L)  12/02/2021 366 (L)  10/20/2020 383 (L)     Health Maintenance  Topic Date Due   Hepatitis B Vaccines 19-59 Average Risk (1 of 3 - 19+ 3-dose series) Never done   Influenza Vaccine  11/25/2023   COVID-19 Vaccine (4 - 2025-26 season) 12/26/2023   Pneumococcal Vaccine: 50+ Years (4 of 4 - PCV20 or PCV21) 02/06/2025   DTaP/Tdap/Td (2 - Td or Tdap) 11/02/2025   Colonoscopy  08/25/2031   Hepatitis C Screening  Completed   HIV Screening  Completed   Zoster Vaccines- Shingrix   Completed   HPV VACCINES  Aged Out   Meningococcal B Vaccine  Aged Out      Review of Systems  Constitutional:  Negative for chills, fever and weight loss.  Respiratory:  Negative for cough and shortness of breath.   Cardiovascular:  Positive for leg swelling (L > R). Negative for chest pain.  Gastrointestinal:  Negative for constipation and diarrhea.  Genitourinary:  Positive for urgency. Negative for dysuria.    Please see HPI. All other systems reviewed and negative.     Objective:  Physical Exam Vitals reviewed.  Constitutional:      Appearance: Normal appearance. He is obese.  HENT:     Mouth/Throat:     Mouth: Mucous membranes are moist.     Pharynx: No oropharyngeal exudate.  Eyes:     Extraocular Movements: Extraocular  movements intact.     Pupils: Pupils are equal, round, and reactive to light.  Cardiovascular:     Rate and Rhythm: Normal rate and regular rhythm.  Pulmonary:     Effort: Pulmonary effort is normal.     Breath sounds: Normal breath sounds.  Abdominal:     General: Bowel sounds are normal. There is no distension.     Palpations: Abdomen is soft.     Tenderness: There is no abdominal tenderness.  Musculoskeletal:     Cervical back: Normal range of motion and neck supple.     Right lower leg: Edema present.     Left lower leg: Edema present.     Comments: LLE slightly larger than RLE.   Neurological:     General: No focal deficit present.     Mental Status: He is alert.  Psychiatric:        Mood and Affect: Mood normal.            Assessment & Plan:

## 2024-03-20 NOTE — Assessment & Plan Note (Signed)
 Will see if he can get cabaneuva Well see if he can get wegovy Flu shot today Refuses covid RSV at pharmacy Will call him with results of labs and opnioin from pharm

## 2024-03-21 ENCOUNTER — Other Ambulatory Visit: Payer: Self-pay

## 2024-03-21 ENCOUNTER — Telehealth: Payer: Self-pay

## 2024-03-21 ENCOUNTER — Other Ambulatory Visit (HOSPITAL_COMMUNITY): Payer: Self-pay

## 2024-03-21 NOTE — Telephone Encounter (Signed)
 Test claim shows patients insurance doesn't cover [phone]:  Per test claim: Cost exceeds maximum. Maximum cost is $1000   Cabenuva requires a prior authorization.   PA initiated.

## 2024-03-22 LAB — COMPREHENSIVE METABOLIC PANEL WITH GFR
ALT: 28 IU/L (ref 0–44)
AST: 33 IU/L (ref 0–40)
Albumin: 4.3 g/dL (ref 3.8–4.9)
Alkaline Phosphatase: 84 IU/L (ref 47–123)
BUN/Creatinine Ratio: 12 (ref 9–20)
BUN: 14 mg/dL (ref 6–24)
Bilirubin Total: 0.5 mg/dL (ref 0.0–1.2)
CO2: 22 mmol/L (ref 20–29)
Calcium: 9.2 mg/dL (ref 8.7–10.2)
Chloride: 109 mmol/L — ABNORMAL HIGH (ref 96–106)
Creatinine, Ser: 1.21 mg/dL (ref 0.76–1.27)
Globulin, Total: 2.4 g/dL (ref 1.5–4.5)
Glucose: 90 mg/dL (ref 70–99)
Potassium: 4.1 mmol/L (ref 3.5–5.2)
Sodium: 145 mmol/L — ABNORMAL HIGH (ref 134–144)
Total Protein: 6.7 g/dL (ref 6.0–8.5)
eGFR: 69 mL/min/1.73 (ref >59)

## 2024-03-22 LAB — RPR, QUANT+TP ABS (REFLEX)
Rapid Plasma Reagin, Quant: 1:2 {titer} — ABNORMAL HIGH
T Pallidum Abs: REACTIVE — AB

## 2024-03-22 LAB — CBC
Hematocrit: 47.6 % (ref 37.5–51.0)
Hemoglobin: 16.3 g/dL (ref 13.0–17.7)
MCH: 32.7 pg (ref 26.6–33.0)
MCHC: 34.2 g/dL (ref 31.5–35.7)
MCV: 95 fL (ref 79–97)
Platelets: 157 x10E3/uL (ref 150–450)
RBC: 4.99 x10E6/uL (ref 4.14–5.80)
RDW: 13.2 % (ref 11.6–15.4)
WBC: 4.3 x10E3/uL (ref 3.4–10.8)

## 2024-03-22 LAB — HIV-1 RNA QUANT-NO REFLEX-BLD: HIV-1 RNA Viral Load: <20 {copies}/mL

## 2024-03-22 LAB — LIPID PANEL
Chol/HDL Ratio: 4.9 ratio (ref 0.0–5.0)
Cholesterol, Total: 187 mg/dL (ref 100–199)
HDL: 38 mg/dL — ABNORMAL LOW (ref >39)
LDL Chol Calc (NIH): 113 mg/dL — ABNORMAL HIGH (ref 0–99)
Triglycerides: 207 mg/dL — ABNORMAL HIGH (ref 0–149)
VLDL Cholesterol Cal: 36 mg/dL (ref 5–40)

## 2024-03-22 LAB — SYPHILIS: RPR W/REFLEX TO RPR TITER AND TREPONEMAL ANTIBODIES, TRADITIONAL SCREENING AND DIAGNOSIS ALGORITHM: RPR Ser Ql: REACTIVE — AB

## 2024-03-23 LAB — T-HELPER CELLS (CD4) COUNT (NOT AT ARMC)
CD4 % Helper T Cell: 36 % (ref 33–65)
CD4 T Cell Abs: 478 /uL (ref 400–1790)

## 2024-03-26 ENCOUNTER — Other Ambulatory Visit (HOSPITAL_COMMUNITY): Payer: Self-pay

## 2024-03-26 NOTE — Telephone Encounter (Signed)
 Per [employer]:   [employer] does not manage PA for this patient. Please contact the number on the back of the members card for further assistance   Contacted insurance and PA form received from [employer] site.

## 2024-03-26 NOTE — Telephone Encounter (Signed)
 Prior authorization submitted for CABENUVA to [employer] via FAX.

## 2024-03-27 NOTE — Telephone Encounter (Signed)
 Faxed additional info per insurance request.   EOC ID: [id number]  To check on PA status visit [email or web address] and click on check status.

## 2024-03-29 NOTE — Telephone Encounter (Signed)
 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 Will not be able to get wegovy Will look at other options, does not want to wait 6 months for oral options.  Will keep him updated.

## 2024-04-03 NOTE — Telephone Encounter (Signed)
 Faxed recent labs per insurance request.

## 2024-04-06 NOTE — Telephone Encounter (Signed)
 Pharmacy Patient Advocate Encounter  Received notification from [employer] that Prior Authorization for CABENUVA 600-900mg  has been APPROVED from 04/05/24 to 04/04/25   PA #/Case ID/Reference #: [id number]  Medication can be filled at [facility] and administered at [facility].

## 2024-04-12 ENCOUNTER — Telehealth: Payer: Self-pay | Admitting: Infectious Diseases

## 2024-04-12 ENCOUNTER — Other Ambulatory Visit (HOSPITAL_COMMUNITY): Payer: Self-pay

## 2024-04-17 NOTE — Telephone Encounter (Signed)
 Let pt know he has been approved for cabaneuva.  Awaiting info on wt loss rx.
